# Patient Record
Sex: Female | Born: 1950
Health system: Southern US, Community
[De-identification: ages and names within clinical notes are randomized; demographics above are authoritative.]

## PROBLEM LIST (undated history)

## (undated) DIAGNOSIS — C801 Malignant (primary) neoplasm, unspecified: Secondary | ICD-10-CM

## (undated) HISTORY — PX: CHOLECYSTECTOMY: SHX55

## (undated) HISTORY — PX: CARPAL TUNNEL RELEASE: SHX101

---

## 1998-10-19 ENCOUNTER — Ambulatory Visit (HOSPITAL_BASED_OUTPATIENT_CLINIC_OR_DEPARTMENT_OTHER): Admission: RE | Admit: 1998-10-19 | Discharge: 1998-10-19 | Payer: Self-pay | Admitting: Plastic Surgery

## 2001-07-30 DIAGNOSIS — Z9221 Personal history of antineoplastic chemotherapy: Secondary | ICD-10-CM

## 2001-07-30 DIAGNOSIS — C801 Malignant (primary) neoplasm, unspecified: Secondary | ICD-10-CM

## 2001-07-30 HISTORY — PX: BREAST SURGERY: SHX581

## 2001-07-30 HISTORY — PX: LIPOSUCTION: SHX10

## 2001-07-30 HISTORY — PX: AUGMENTATION MAMMAPLASTY: SUR837

## 2001-07-30 HISTORY — DX: Malignant (primary) neoplasm, unspecified: C80.1

## 2001-07-30 HISTORY — DX: Personal history of antineoplastic chemotherapy: Z92.21

## 2001-07-30 HISTORY — PX: BREAST LUMPECTOMY: SHX2

## 2002-02-02 ENCOUNTER — Encounter: Payer: Self-pay | Admitting: *Deleted

## 2002-02-02 ENCOUNTER — Ambulatory Visit (HOSPITAL_COMMUNITY): Admission: RE | Admit: 2002-02-02 | Discharge: 2002-02-02 | Payer: Self-pay | Admitting: *Deleted

## 2002-02-06 ENCOUNTER — Encounter: Payer: Self-pay | Admitting: *Deleted

## 2002-02-06 ENCOUNTER — Ambulatory Visit (HOSPITAL_COMMUNITY): Admission: RE | Admit: 2002-02-06 | Discharge: 2002-02-06 | Payer: Self-pay | Admitting: *Deleted

## 2002-06-10 ENCOUNTER — Encounter: Admission: RE | Admit: 2002-06-10 | Discharge: 2002-06-10 | Payer: Self-pay | Admitting: Oncology

## 2002-06-10 ENCOUNTER — Encounter (HOSPITAL_COMMUNITY): Admission: RE | Admit: 2002-06-10 | Discharge: 2002-07-10 | Payer: Self-pay | Admitting: Oncology

## 2002-06-17 ENCOUNTER — Encounter (HOSPITAL_COMMUNITY): Payer: Self-pay | Admitting: Oncology

## 2002-06-29 ENCOUNTER — Ambulatory Visit (HOSPITAL_COMMUNITY): Admission: RE | Admit: 2002-06-29 | Discharge: 2002-06-29 | Payer: Self-pay | Admitting: General Surgery

## 2002-06-29 ENCOUNTER — Encounter: Payer: Self-pay | Admitting: General Surgery

## 2002-07-13 ENCOUNTER — Encounter (HOSPITAL_COMMUNITY): Admission: RE | Admit: 2002-07-13 | Discharge: 2002-08-12 | Payer: Self-pay | Admitting: Oncology

## 2002-07-13 ENCOUNTER — Encounter: Admission: RE | Admit: 2002-07-13 | Discharge: 2002-07-13 | Payer: Self-pay | Admitting: Oncology

## 2002-08-18 ENCOUNTER — Encounter: Admission: RE | Admit: 2002-08-18 | Discharge: 2002-08-18 | Payer: Self-pay | Admitting: Oncology

## 2002-08-18 ENCOUNTER — Encounter (HOSPITAL_COMMUNITY): Admission: RE | Admit: 2002-08-18 | Discharge: 2002-09-17 | Payer: Self-pay | Admitting: Oncology

## 2002-09-28 ENCOUNTER — Encounter: Admission: RE | Admit: 2002-09-28 | Discharge: 2002-09-28 | Payer: Self-pay | Admitting: Oncology

## 2002-09-28 ENCOUNTER — Encounter (HOSPITAL_COMMUNITY): Admission: RE | Admit: 2002-09-28 | Discharge: 2002-10-28 | Payer: Self-pay | Admitting: Oncology

## 2002-10-29 ENCOUNTER — Encounter (HOSPITAL_COMMUNITY): Admission: RE | Admit: 2002-10-29 | Discharge: 2002-11-28 | Payer: Self-pay | Admitting: Oncology

## 2002-10-29 ENCOUNTER — Encounter (HOSPITAL_COMMUNITY): Payer: Self-pay | Admitting: Oncology

## 2002-10-29 ENCOUNTER — Encounter: Admission: RE | Admit: 2002-10-29 | Discharge: 2002-10-29 | Payer: Self-pay | Admitting: Oncology

## 2002-12-03 ENCOUNTER — Encounter (HOSPITAL_COMMUNITY): Admission: RE | Admit: 2002-12-03 | Discharge: 2003-01-02 | Payer: Self-pay | Admitting: Oncology

## 2002-12-03 ENCOUNTER — Encounter: Admission: RE | Admit: 2002-12-03 | Discharge: 2002-12-03 | Payer: Self-pay | Admitting: Oncology

## 2003-01-05 ENCOUNTER — Encounter: Admission: RE | Admit: 2003-01-05 | Discharge: 2003-01-05 | Payer: Self-pay | Admitting: Oncology

## 2003-01-05 ENCOUNTER — Encounter (HOSPITAL_COMMUNITY): Admission: RE | Admit: 2003-01-05 | Discharge: 2003-02-04 | Payer: Self-pay | Admitting: Oncology

## 2003-02-08 ENCOUNTER — Encounter: Payer: Self-pay | Admitting: Internal Medicine

## 2003-02-08 ENCOUNTER — Ambulatory Visit (HOSPITAL_COMMUNITY): Admission: RE | Admit: 2003-02-08 | Discharge: 2003-02-08 | Payer: Self-pay | Admitting: Internal Medicine

## 2003-02-15 ENCOUNTER — Ambulatory Visit (HOSPITAL_COMMUNITY): Admission: RE | Admit: 2003-02-15 | Discharge: 2003-02-15 | Payer: Self-pay | Admitting: Internal Medicine

## 2003-02-15 ENCOUNTER — Encounter: Payer: Self-pay | Admitting: Internal Medicine

## 2003-02-17 ENCOUNTER — Encounter: Payer: Self-pay | Admitting: General Surgery

## 2003-02-17 ENCOUNTER — Ambulatory Visit (HOSPITAL_COMMUNITY): Admission: RE | Admit: 2003-02-17 | Discharge: 2003-02-17 | Payer: Self-pay | Admitting: General Surgery

## 2003-03-02 ENCOUNTER — Encounter: Admission: RE | Admit: 2003-03-02 | Discharge: 2003-03-02 | Payer: Self-pay | Admitting: Oncology

## 2003-03-02 ENCOUNTER — Encounter (HOSPITAL_COMMUNITY): Admission: RE | Admit: 2003-03-02 | Discharge: 2003-04-01 | Payer: Self-pay | Admitting: Oncology

## 2003-03-30 ENCOUNTER — Emergency Department (HOSPITAL_COMMUNITY): Admission: EM | Admit: 2003-03-30 | Discharge: 2003-03-30 | Payer: Self-pay | Admitting: Emergency Medicine

## 2003-03-30 ENCOUNTER — Encounter: Payer: Self-pay | Admitting: Emergency Medicine

## 2003-04-16 ENCOUNTER — Ambulatory Visit (HOSPITAL_COMMUNITY): Admission: RE | Admit: 2003-04-16 | Discharge: 2003-04-16 | Payer: Self-pay | Admitting: General Surgery

## 2003-04-27 ENCOUNTER — Encounter: Admission: RE | Admit: 2003-04-27 | Discharge: 2003-04-27 | Payer: Self-pay | Admitting: Oncology

## 2003-04-27 ENCOUNTER — Encounter (HOSPITAL_COMMUNITY): Admission: RE | Admit: 2003-04-27 | Discharge: 2003-04-29 | Payer: Self-pay | Admitting: Oncology

## 2003-08-24 ENCOUNTER — Encounter (HOSPITAL_COMMUNITY): Admission: RE | Admit: 2003-08-24 | Discharge: 2003-09-23 | Payer: Self-pay | Admitting: Oncology

## 2003-08-24 ENCOUNTER — Encounter: Admission: RE | Admit: 2003-08-24 | Discharge: 2003-08-24 | Payer: Self-pay | Admitting: Oncology

## 2004-02-09 ENCOUNTER — Ambulatory Visit (HOSPITAL_COMMUNITY): Admission: RE | Admit: 2004-02-09 | Discharge: 2004-02-09 | Payer: Self-pay | Admitting: Internal Medicine

## 2004-08-23 ENCOUNTER — Ambulatory Visit (HOSPITAL_COMMUNITY): Admission: RE | Admit: 2004-08-23 | Discharge: 2004-08-23 | Payer: Self-pay | Admitting: Internal Medicine

## 2004-12-20 ENCOUNTER — Ambulatory Visit (HOSPITAL_COMMUNITY): Admission: RE | Admit: 2004-12-20 | Discharge: 2004-12-20 | Payer: Self-pay | Admitting: Family Medicine

## 2004-12-26 ENCOUNTER — Ambulatory Visit (HOSPITAL_COMMUNITY): Admission: RE | Admit: 2004-12-26 | Discharge: 2004-12-26 | Payer: Self-pay | Admitting: Internal Medicine

## 2005-02-21 ENCOUNTER — Ambulatory Visit (HOSPITAL_COMMUNITY): Admission: RE | Admit: 2005-02-21 | Discharge: 2005-02-21 | Payer: Self-pay | Admitting: Internal Medicine

## 2006-02-27 ENCOUNTER — Ambulatory Visit (HOSPITAL_COMMUNITY): Admission: RE | Admit: 2006-02-27 | Discharge: 2006-02-27 | Payer: Self-pay | Admitting: Internal Medicine

## 2007-03-19 ENCOUNTER — Ambulatory Visit (HOSPITAL_COMMUNITY): Admission: RE | Admit: 2007-03-19 | Discharge: 2007-03-19 | Payer: Self-pay | Admitting: Internal Medicine

## 2008-03-29 ENCOUNTER — Ambulatory Visit (HOSPITAL_COMMUNITY): Admission: RE | Admit: 2008-03-29 | Discharge: 2008-03-29 | Payer: Self-pay | Admitting: Internal Medicine

## 2009-04-07 ENCOUNTER — Ambulatory Visit (HOSPITAL_COMMUNITY): Admission: RE | Admit: 2009-04-07 | Discharge: 2009-04-07 | Payer: Self-pay | Admitting: Internal Medicine

## 2010-05-25 ENCOUNTER — Ambulatory Visit (HOSPITAL_COMMUNITY): Admission: RE | Admit: 2010-05-25 | Discharge: 2010-05-25 | Payer: Self-pay | Admitting: Internal Medicine

## 2010-12-13 ENCOUNTER — Ambulatory Visit (HOSPITAL_COMMUNITY)
Admission: RE | Admit: 2010-12-13 | Discharge: 2010-12-13 | Disposition: A | Discharge status: Home / Self Care | Payer: 59 | Source: Ambulatory Visit | Attending: Family Medicine | Admitting: Family Medicine

## 2010-12-13 ENCOUNTER — Other Ambulatory Visit (HOSPITAL_COMMUNITY): Payer: Self-pay | Admitting: Family Medicine

## 2010-12-13 DIAGNOSIS — N6320 Unspecified lump in the left breast, unspecified quadrant: Secondary | ICD-10-CM

## 2010-12-13 DIAGNOSIS — Z853 Personal history of malignant neoplasm of breast: Secondary | ICD-10-CM | POA: Insufficient documentation

## 2010-12-15 NOTE — H&P (Signed)
NAME:  Angelica White, Angelica White NO.:  [id number]   MEDICAL RECORD NO.:  [id number]                   PATIENT TYPE:   LOCATION:                                       FACILITY:  [facility]   PHYSICIAN:  Dalia Heading, M.D.               DATE OF BIRTH:  1951/02/14   DATE OF ADMISSION:  DATE OF DISCHARGE:                                HISTORY & PHYSICAL   CHIEF COMPLAINT:  Right breast mass, nonpalpable.   HISTORY OF PRESENT ILLNESS:  The patient is a 60 year old white female who  was referred for evaluation and treatment of microcalcification seen in the  right breast.  This was seen on routine mammography.  She has a history of  left breast carcinoma, status post mastectomy with TRAM flap reconstruction,  in the past,  in 2003 at [facility].  She denies any recent nipple  discharge.   PAST MEDICAL HISTORY:  As noted above.   PAST SURGICAL HISTORY:  1. As noted above.  2. Cholecystectomy.  3. Foot surgery.  4. Hysterectomy.   CURRENT MEDICATIONS:  1. Percocet as needed for pain.  2. Lovenox.   ALLERGIES:  AUGMENTIN.   REVIEW OF SYSTEMS:  Patient had a right leg DVT diagnosed recently.  She did  not tolerate Coumadin and thus was placed on Lovenox.   PHYSICAL EXAMINATION:  GENERAL:  Patient is a well-developed, well-  nourished, white female in no acute distress.  VITAL SIGNS:  She is afebrile and vital signs are stable.  LUNGS:  Clear to auscultation with equal breath sounds bilaterally.  HEART:  Reveals a regular rate and rhythm without S3, S4 or murmurs.  CHEST:  Right breast examination reveals no dominant mass, nipple discharge  or dimpling.  The axilla is negative for palpable nodes.  Left breast examination reveals a deformed breast due to reconstructive  surgery.   Mammogram reveals a suspicious microcalcification seen in the upper, outer  quadrant of the right breast.   IMPRESSION:  1. Right breast mass, nonpalpable.  2. History  of left breast carcinoma.   PLAN:  The patient is scheduled for right breast biopsy after needle  localization on February 17, 2003.  The risks and benefits of the procedure  including bleeding and infection were fully explained to the patient, gave  informed consent.  She has stopped her Lovenox today for her surgery  tomorrow.                                               Dalia Heading, M.D.    MAJ/MEDQ  D:  02/16/2003  T:  02/16/2003  Job:  [id number]   cc:   Ladona Horns. Neijstrom, MD  [address]  [place]  [place] [address]  Fax: [phone]   Patrica Duel, M.D.  [address]  [place]  [place] [address]  Fax: [phone]

## 2010-12-15 NOTE — Procedures (Signed)
   NAMELAVEDA, DEMEDEIROS NO.:  [id number]   MEDICAL RECORD NO.:  [id number]                    PATIENT TYPE:   LOCATION:                                       FACILITY:   PHYSICIAN:  Vida Roller, M.D.                DATE OF BIRTH:   DATE OF PROCEDURE:  DATE OF DISCHARGE:                                  ECHOCARDIOGRAM   REFERRING PHYSICIAN:  Ladona Horns. Neijstrom, MD   PROCEDURE:  Echocardiogram.   TAPE:  #[id number], tape count 3482 to 3900   REASON FOR PROCEDURE:  This is a 60 year old lady followup chemotherapy for  breast cancer.  This is an assessment for LV function.   QUALITY OF STUDY:  Technical quality of this study is poor.   M-MODE MEASUREMENTS:  1. The aorta is 30 mm.  2. The left atrium is 36 mm.  3. The septum is 10 mm.  4. The diastolic dimension is 44 mm.  5. The left ventricular systolic dimension is 36 mm.   2-D AND DOPPLER IMAGING:  1. The left ventricle is normal size with normal systolic function.     Diastolic function is beyond the limits of the study.  There was no     obvious wall motion abnormality seen.  2. The right ventricle is normal size with normal systolic function.  3. Both atria appear normal size.  4. The aortic valve is not well seen but appears to function normally with     no evidence of stenosis or regurgitation.  5. The mitral valve is not well seen either but appears to have only trace     mitral insufficiency.  No stenosis is seen.  6. The tricuspid valve is not well seen.  7. The pulmonic valve is not well seen.  8. The pericardial structures appear normal.  9. The ascending aorta is not well seen.                                               Vida Roller, M.D.    JH/MEDQ  D:  10/05/2002  T:  10/05/2002  Job:  [id number]

## 2010-12-15 NOTE — Op Note (Signed)
   NAME:  Angelica White, Angelica White                         ACCOUNT NO.:  [id number]   MEDICAL RECORD NO.:  [id number]                   PATIENT TYPE:  AMB   LOCATION:  DAY                                  FACILITY:  [facility]   PHYSICIAN:  Dalia Heading, M.D.               DATE OF BIRTH:  May 16, 1951   DATE OF PROCEDURE:  04/16/2003  DATE OF DISCHARGE:                                 OPERATIVE REPORT   PREOPERATIVE DIAGNOSIS:  History of left breast carcinoma, finished with  chemotherapy.   POSTOPERATIVE DIAGNOSIS:  History of left breast carcinoma, finished with  chemotherapy.   PROCEDURE:  Port-A-Cath removal   SURGEON:  Dalia Heading, M.D.   ANESTHESIA:  Local   INDICATIONS:  The patient is a 60 year old white female with a history of  breast carcinoma who now presents for Port-A-Cath removal.  The risks and  benefits of the procedure were fully explained to the patient, who gave  informed consent.   DESCRIPTION OF PROCEDURE:  The patient was placed in the supine position.  The right upper chest was prepped and draped using the usual sterile  technique with Betadine.  Surgical site confirmation was performed.  Six  cc's of 1% Xylocaine was used for local anesthesia.   An incision was made through the previous surgical scar.  It was taken down  to the port.  The Port-A-Cath was removed in total without difficulty. No  significant bleeding was noted.  The subcutaneous layer was reapproximated  using a 3-0 Vicryl interrupted suture.  The skin was closed using a 4-0  Vicryl subcuticular suture.  Steri-Strips and a dry sterile dressing were  applied.   All tape and needle counts were correct at the end of the procedure.  The  patient was transferred to Day Surgery in stable condition.   COMPLICATIONS:  None    SPECIMEN:  Port-A-Cath   BLOOD LOSS:  Minimal                                               Dalia Heading, M.D.    MAJ/MEDQ  D:  04/16/2003  T:  04/16/2003  Job:   [id number]   cc:   Ladona Horns. Neijstrom, MD  [address]  [place]  [place] [address]  Fax: [phone]   Patrica Duel, M.D.  [address]  [place]  [place] [address]  Fax: [phone]

## 2010-12-15 NOTE — Op Note (Signed)
NAME:  Angelica White, Angelica White                         ACCOUNT NO.:  [id number]   MEDICAL RECORD NO.:  [id number]                   PATIENT TYPE:  AMB   LOCATION:  DAY                                  FACILITY:  [facility]   PHYSICIAN:  Dalia Heading, M.D.               DATE OF BIRTH:  1951/06/17   DATE OF PROCEDURE:  DATE OF DISCHARGE:                                 OPERATIVE REPORT   PREOPERATIVE DIAGNOSIS:  Right breast mass, nonpalpable.   POSTOPERATIVE DIAGNOSIS:  Right breast mass, nonpalpable.   PROCEDURE:  Right breast biopsy after needle localization.   SURGEON:  Dalia Heading, M.D.   ANESTHESIA:  MAC   INDICATIONS:  The patient is a 60 year old white female with a history of  left breast carcinoma who now presents with microcalcifications in the  upper, outer quadrant of the right breast.  She comes to the operating room  after undergoing needle localization in the mammography suite for right  breast biopsy after needle localization.  The risks and benefits of the  procedure including bleeding and infection were fully explained to the  patient, who gave informed consent.   DESCRIPTION OF PROCEDURE:  The patient was placed in the supine position.  The right breast was prepped and draped using the usual sterile technique  with Betadine.  Surgical site confirmation was performed.  Xylocaine 1% was  used for local anesthesia.   A transverse incision was made across where the wire entered the skin.  The  dissection was taken down to the tip of the wire and a circumferential area  of right breast tissue was removed.  Specimen radiography revealed that most  of the microcalcifications were within the specimen removed.  The patient  was noted to have significant sclerosis in the right breast region.  Any  bleeding was controlled using Bovie electrocautery.  The specimen was then  sent to pathology for further examination.  The wound was irrigated with  normal saline.  The skin was  closed using a 4-0 Vicryl subcuticular suture.  Steri-Strips and a dry sterile dressing were applied.   All tape and needle counts were correct at the end of the procedure.  The  patient was awakened ad transferred to Day Surgery in stable condition.   COMPLICATIONS:  None.    SPECIMEN:  Right breast microcalcifications.   BLOOD LOSS:  Minimal.                                               Dalia Heading, M.D.    MAJ/MEDQ  D:  02/17/2003  T:  02/17/2003  Job:  [id number]   cc:   Ladona Horns. Neijstrom, MD  [address]  [place]  [place] [address]  Fax: [phone]   Loraine Leriche  Nobie Putnam, M.D.  [address]  [place]  [place] [address]  Fax: [phone]

## 2010-12-15 NOTE — Op Note (Signed)
   NAME:  Angelica White, Angelica White                         ACCOUNT NO.:  [id number]   MEDICAL RECORD NO.:  [id number]                   PATIENT TYPE:  AMB   LOCATION:  DAY                                  FACILITY:  [facility]   PHYSICIAN:  Dalia Heading, M.D.               DATE OF BIRTH:  10/28/1950   DATE OF PROCEDURE:  06/29/2002  DATE OF DISCHARGE:                                 OPERATIVE REPORT   PREOPERATIVE DIAGNOSIS:  Left breast carcinoma.   POSTOPERATIVE DIAGNOSIS:  Left breast carcinoma.   PROCEDURE:  Port-A-Cath insertion.   ANESTHESIA:  MAC.   INDICATIONS:  The patient is a 60 year old white female who presents for  Port-A-Cath insertion.  She is about to undergo chemotherapy for left breast  carcinoma.  The risks and benefits of the procedure, including bleeding,  infection, and the possibility of a pneumothorax were fully explained to the  patient, gaining informed consent.   DESCRIPTION OF PROCEDURE:  The patient was placed in the Trendelenburg  position after the right upper chest was prepped and draped using the usual  sterile technique with Betadine.  Xylocaine 1% was used for local  anesthesia.  Surgical site confirmation was performed.   A transverse incision was made just below the right clavicle.  This was  taken down to the subcutaneous tissue.  A needle was advanced into the right  subclavian vein without difficulty.  A guidewire was then advanced into the  superior vena cava under fluoroscopic guidance.  An introducer and peel away  sheath were then placed over the guidewire.  A catheter was then inserted  through the peel away sheath and the peel away sheath was removed.  The  catheter was then attached to the port and the port was placed in a  subcutaneous pocket.  Fluoroscopy was used to confirm adequate position.  The port was flushed with 3000 units of heparin.  The subcutaneous layer was  reapproximated using a 4-0 Vicryl interrupted suture.  The skin was  closed  using a 4-0 Vicryl subcuticular suture.  Steri-Strips and a dry sterile  dressing were applied.   All needle counts were correct at the end of the procedure.  The patient was  transferred to PACU in stable condition.  Complications:  None.  Specimens:  None.  Blood loss:  Minimal.                                               Dalia Heading, M.D.    MAJ/MEDQ  D:  06/29/2002  T:  06/29/2002  Job:  [id number]   cc:   Ladona Horns. Neijstrom, M.D.  [address]  [place]  [place] [address]  Fax: [phone]

## 2010-12-15 NOTE — H&P (Signed)
   NAME:  Angelica White, Angelica White NO.:  [id number]   MEDICAL RECORD NO.:  [id number]                   PATIENT TYPE:   LOCATION:                                       FACILITY:   PHYSICIAN:  Dalia Heading, M.D.               DATE OF BIRTH:  November 22, 1950   DATE OF ADMISSION:  04/12/2003  DATE OF DISCHARGE:                                HISTORY & PHYSICAL   CHIEF COMPLAINT:  History of right breast carcinoma, finished with  chemotherapy.   HISTORY OF PRESENT ILLNESS:  The patient is a 60 year old, white female who  is referred for a Port-A-Cath removal.  She had a Port-A-Cath placed in  November 2003, and now presents for Port-A-Cath removal.   PAST MEDICAL HISTORY:  As noted above.   PAST SURGICAL HISTORY:  1. Right breast biopsy with needle localization in July 2004.  2. Multiple surgeries for her left breast carcinoma.  3. Cholecystectomy.  4. Foot surgery.  5. Hysterectomy.   MEDICATIONS:  1. Percocet as needed for pain.  2. Coumadin, which she is holding, and has switched to Lovenox.   ALLERGIES:  AUGMENTIN.   REVIEW OF SYMPTOMS:  The patient had a right leg DVT diagnosed in the past.   PHYSICAL EXAMINATION:  GENERAL:  The patient is a well-developed, well-  nourished, white female in no acute distress.  VITAL SIGNS:  Afebrile with vital signs stable.  LUNGS:  Clear to auscultation with equal breath sounds bilaterally.  HEART:  Regular rate and rhythm without S3, S4 or murmurs.  CHEST:  Port-A-Cath in place in the right upper chest.  She also has  multiple reconstructions on the left breast.   IMPRESSION:  Left breast carcinoma, finished with chemotherapy.    PLAN:  The patient is scheduled for Port-A-Cath removal on April 16, 2003.  The risks and benefits of the procedure, including bleeding and  infection were fully explained to the patient gaining informed consent.                                               Dalia Heading,  M.D.    MAJ/MEDQ  D:  04/12/2003  T:  04/12/2003  Job:  [id number]   cc:   Ladona Horns. Neijstrom, MD  [address]  [place]  [place] [address]  Fax: [phone]   Patrica Duel, M.D.  [address]  [place]  [place] [address]  Fax: [phone]

## 2010-12-15 NOTE — H&P (Signed)
   NAME:  Angelica White, Angelica White NO.:  [id number]   MEDICAL RECORD NO.:  [id number]                  PATIENT TYPE:   LOCATION:                                       FACILITY:   PHYSICIAN:  Dalia Heading, M.D.               DATE OF BIRTH:  12-02-1950   DATE OF ADMISSION:  DATE OF DISCHARGE:                                HISTORY & PHYSICAL   CHIEF COMPLAINT:  Left breast carcinoma, need for chemotherapy.   HISTORY OF PRESENT ILLNESS:  The patient is a 60 year old white female who  is referred for a Port-A-Cath insertion.  She had left breast cancer status  post left mastectomy with reconstruction at [facility] in  September.  She is now about to undergo chemotherapy and needs central  venous access.   PAST MEDICAL HISTORY:  As noted above.   PAST SURGICAL HISTORY:  As noted above, cholecystectomy, foot surgery,  hysterectomy.   CURRENT MEDICATIONS:  Percocet as needed for pain.   ALLERGIES:  AUGMENTIN.   REVIEW OF SYSTEMS:  Noncontributory.   PHYSICAL EXAMINATION:  GENERAL:  The patient is a well-developed, well-  nourished white female in no acute distress.  VITAL SIGNS:  She is afebrile and vital signs are stable.  LUNGS:  Clear to auscultation with equal breath sounds bilaterally.  HEART:  Regular rate and rhythm without S3, S4, murmurs.   IMPRESSION:  Left breast carcinoma, need for central venous access for  chemotherapy.    PLAN:  The patient is scheduled for a Port-A-Cath insertion on June 29, 2002.  The risks and benefits of the procedure including bleeding,  infection, and pneumothorax were fully explained to the patient.  Gave  informed consent.                                               Dalia Heading, M.D.    MAJ/MEDQ  D:  06/23/2002  T:  06/23/2002  Job:  [id number]   cc:   Ladona Horns. Neijstrom, M.D.  [address]  [place]  [place] [address]  Fax: [phone]

## 2013-02-02 ENCOUNTER — Other Ambulatory Visit (HOSPITAL_COMMUNITY): Payer: Self-pay | Admitting: Internal Medicine

## 2013-02-02 DIAGNOSIS — Z139 Encounter for screening, unspecified: Secondary | ICD-10-CM

## 2013-02-23 ENCOUNTER — Other Ambulatory Visit (HOSPITAL_COMMUNITY): Payer: Self-pay | Admitting: General Practice

## 2013-02-23 ENCOUNTER — Ambulatory Visit (HOSPITAL_COMMUNITY)
Admission: RE | Admit: 2013-02-23 | Discharge: 2013-02-23 | Disposition: A | Discharge status: Home / Self Care | Payer: 59 | Source: Ambulatory Visit | Attending: Internal Medicine | Admitting: Internal Medicine

## 2013-02-23 DIAGNOSIS — Z139 Encounter for screening, unspecified: Secondary | ICD-10-CM

## 2013-02-23 DIAGNOSIS — Z1231 Encounter for screening mammogram for malignant neoplasm of breast: Secondary | ICD-10-CM | POA: Insufficient documentation

## 2014-02-03 ENCOUNTER — Other Ambulatory Visit (HOSPITAL_COMMUNITY): Payer: Self-pay | Admitting: Family

## 2014-02-03 DIAGNOSIS — Z1231 Encounter for screening mammogram for malignant neoplasm of breast: Secondary | ICD-10-CM

## 2014-02-24 ENCOUNTER — Ambulatory Visit (HOSPITAL_COMMUNITY)
Admission: RE | Admit: 2014-02-24 | Discharge: 2014-02-24 | Disposition: A | Discharge status: Home / Self Care | Payer: 59 | Source: Ambulatory Visit | Attending: Family | Admitting: Family

## 2014-02-24 DIAGNOSIS — Z1231 Encounter for screening mammogram for malignant neoplasm of breast: Secondary | ICD-10-CM

## 2015-02-14 ENCOUNTER — Other Ambulatory Visit (HOSPITAL_COMMUNITY): Payer: Self-pay | Admitting: Sports Medicine

## 2015-02-14 DIAGNOSIS — R109 Unspecified abdominal pain: Secondary | ICD-10-CM

## 2015-02-17 ENCOUNTER — Ambulatory Visit (HOSPITAL_COMMUNITY): Payer: 59

## 2015-02-21 ENCOUNTER — Other Ambulatory Visit (HOSPITAL_COMMUNITY): Payer: Self-pay | Admitting: Sports Medicine

## 2015-02-21 DIAGNOSIS — R109 Unspecified abdominal pain: Secondary | ICD-10-CM

## 2015-02-23 ENCOUNTER — Ambulatory Visit (HOSPITAL_COMMUNITY)
Admission: RE | Admit: 2015-02-23 | Discharge: 2015-02-23 | Disposition: A | Discharge status: Home / Self Care | Payer: 59 | Source: Ambulatory Visit | Attending: Sports Medicine | Admitting: Sports Medicine

## 2015-02-23 DIAGNOSIS — R109 Unspecified abdominal pain: Secondary | ICD-10-CM

## 2015-02-23 DIAGNOSIS — Z90710 Acquired absence of both cervix and uterus: Secondary | ICD-10-CM | POA: Insufficient documentation

## 2015-02-23 DIAGNOSIS — R932 Abnormal findings on diagnostic imaging of liver and biliary tract: Secondary | ICD-10-CM | POA: Diagnosis not present

## 2015-02-23 DIAGNOSIS — Z9049 Acquired absence of other specified parts of digestive tract: Secondary | ICD-10-CM | POA: Insufficient documentation

## 2015-02-28 ENCOUNTER — Other Ambulatory Visit (HOSPITAL_COMMUNITY): Payer: Self-pay | Admitting: Internal Medicine

## 2015-02-28 DIAGNOSIS — Z1231 Encounter for screening mammogram for malignant neoplasm of breast: Secondary | ICD-10-CM

## 2015-03-07 ENCOUNTER — Ambulatory Visit (HOSPITAL_COMMUNITY)
Admission: RE | Admit: 2015-03-07 | Discharge: 2015-03-07 | Disposition: A | Discharge status: Home / Self Care | Payer: 59 | Source: Ambulatory Visit | Attending: Internal Medicine | Admitting: Internal Medicine

## 2015-03-07 DIAGNOSIS — Z1231 Encounter for screening mammogram for malignant neoplasm of breast: Secondary | ICD-10-CM | POA: Diagnosis present

## 2015-12-19 DIAGNOSIS — H2513 Age-related nuclear cataract, bilateral: Secondary | ICD-10-CM | POA: Diagnosis not present

## 2016-02-27 ENCOUNTER — Other Ambulatory Visit (HOSPITAL_COMMUNITY): Payer: Self-pay | Admitting: Internal Medicine

## 2016-02-27 DIAGNOSIS — Z1231 Encounter for screening mammogram for malignant neoplasm of breast: Secondary | ICD-10-CM

## 2016-03-12 ENCOUNTER — Ambulatory Visit (HOSPITAL_COMMUNITY)
Admission: RE | Admit: 2016-03-12 | Discharge: 2016-03-12 | Disposition: A | Discharge status: Home / Self Care | Payer: Medicare Other | Source: Ambulatory Visit | Attending: Internal Medicine | Admitting: Internal Medicine

## 2016-03-12 DIAGNOSIS — Z1231 Encounter for screening mammogram for malignant neoplasm of breast: Secondary | ICD-10-CM | POA: Insufficient documentation

## 2016-03-12 DIAGNOSIS — E782 Mixed hyperlipidemia: Secondary | ICD-10-CM | POA: Diagnosis not present

## 2016-03-12 DIAGNOSIS — R7301 Impaired fasting glucose: Secondary | ICD-10-CM | POA: Diagnosis not present

## 2016-03-19 DIAGNOSIS — E6609 Other obesity due to excess calories: Secondary | ICD-10-CM | POA: Diagnosis not present

## 2016-03-19 DIAGNOSIS — R7301 Impaired fasting glucose: Secondary | ICD-10-CM | POA: Diagnosis not present

## 2016-06-10 IMAGING — US US TRANSVAGINAL NON-OB
1 series · 14 of 25 positions shown · non-contrast
Comparison: None

CLINICAL DATA: Right back and flank pain. Right lower quadrant
pain. Symptoms for 1 month. Prior hysterectomy.



[Series 1: us transvaginal non-ob · 0.21mm/px · 14 of 74 slices shown]
[im 1/74]
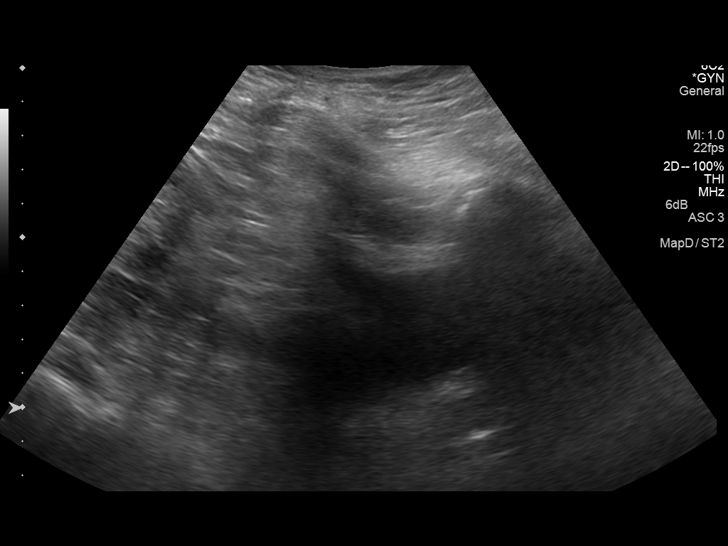
[im 7/74]
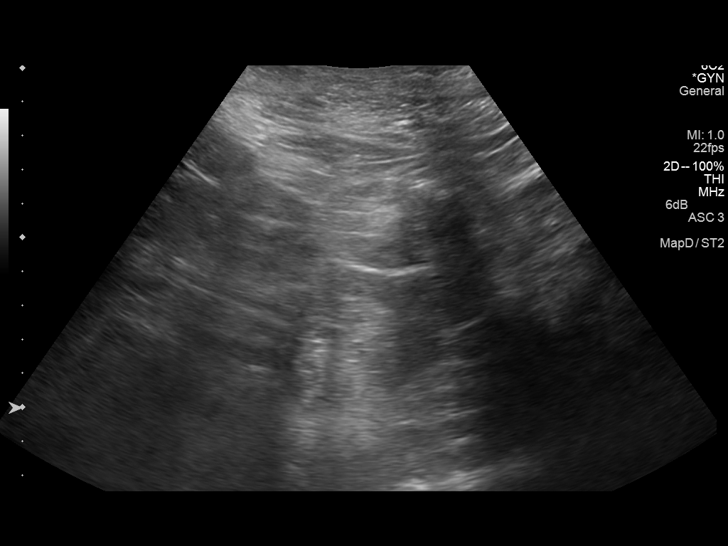
[im 13/74]
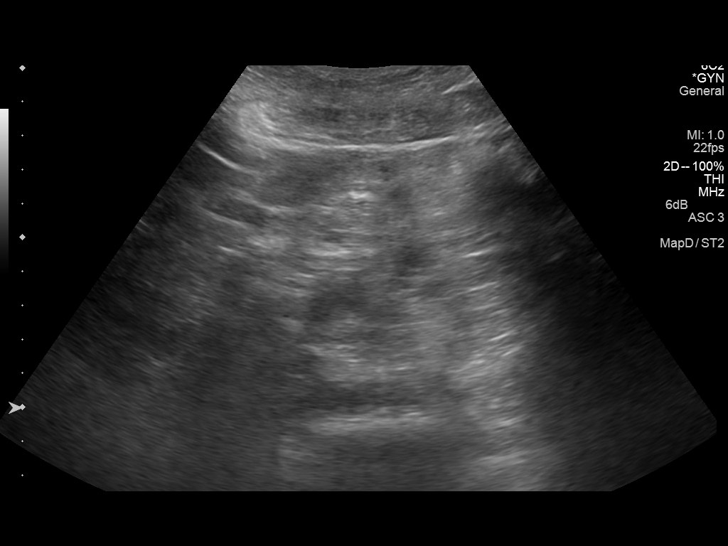
[im 19/74]
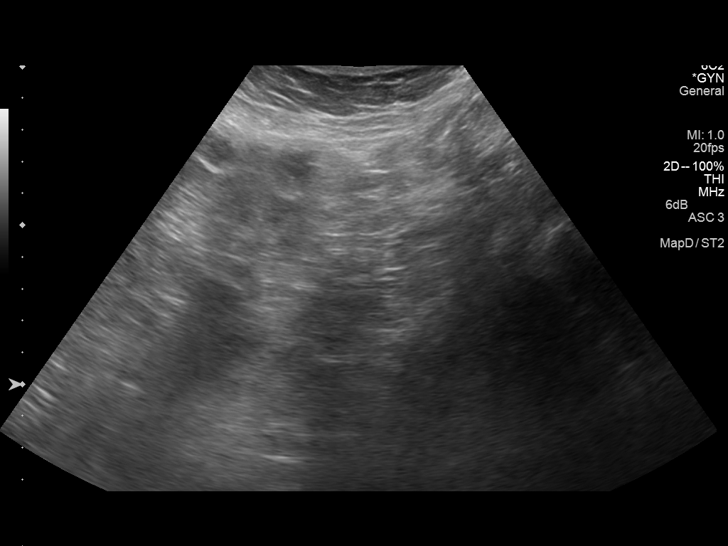
[im 25/74]
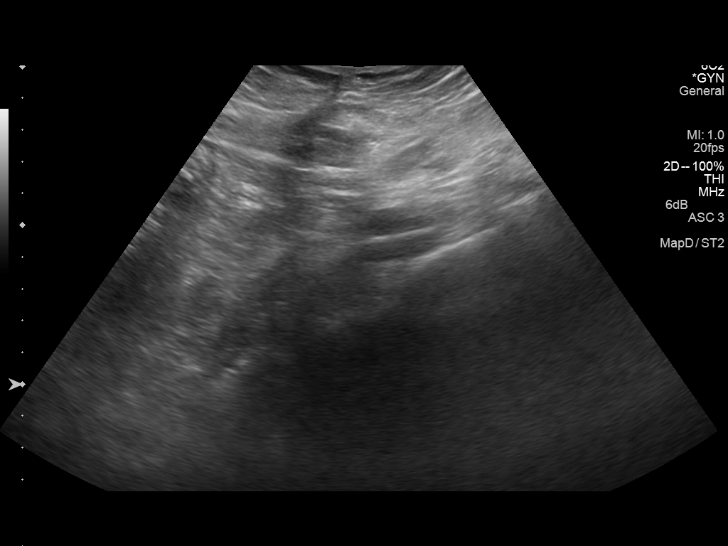
[im 28/74]
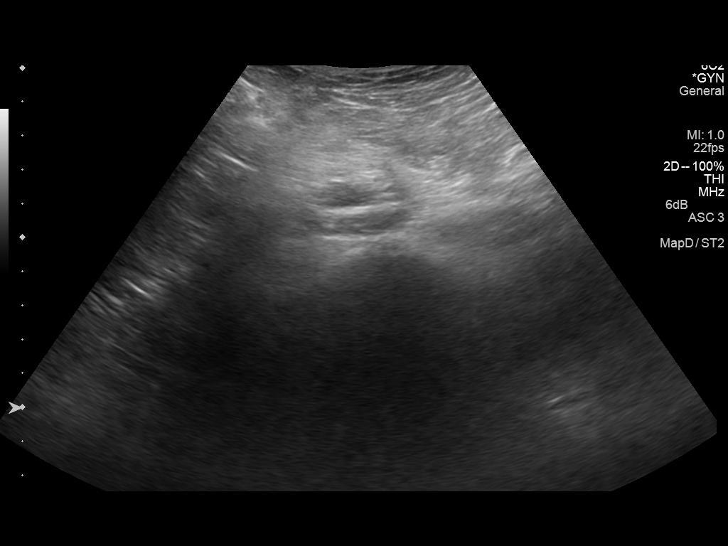
[im 34/74]
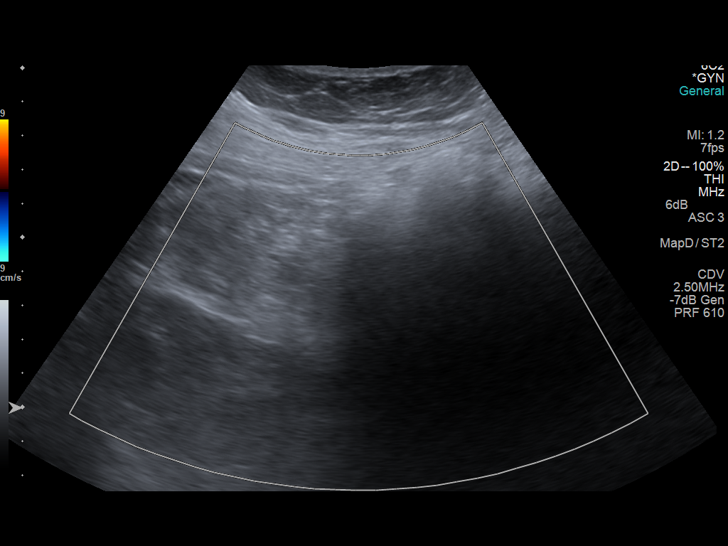
[im 40/74]
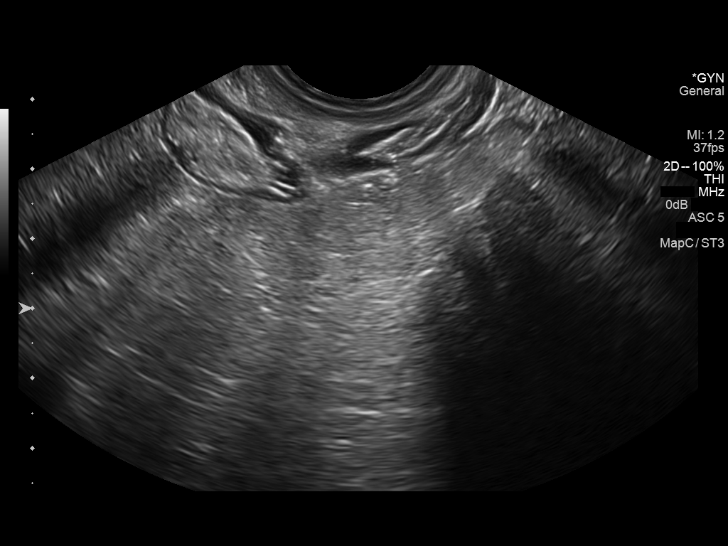
[im 46/74]
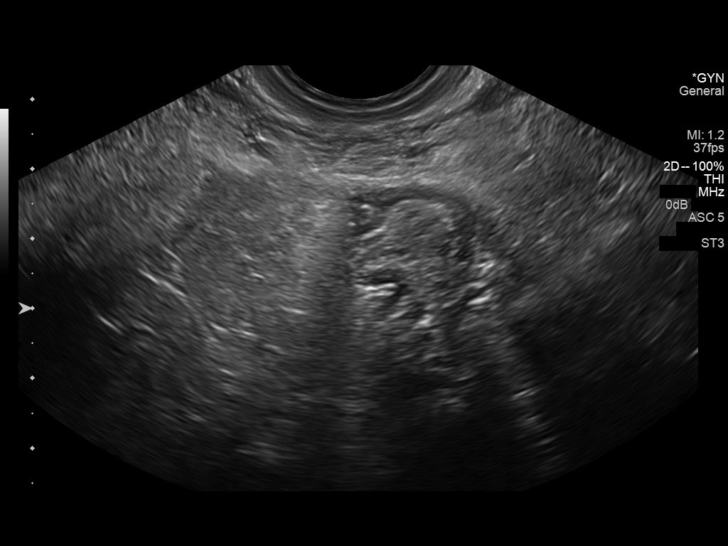
[im 49/74]
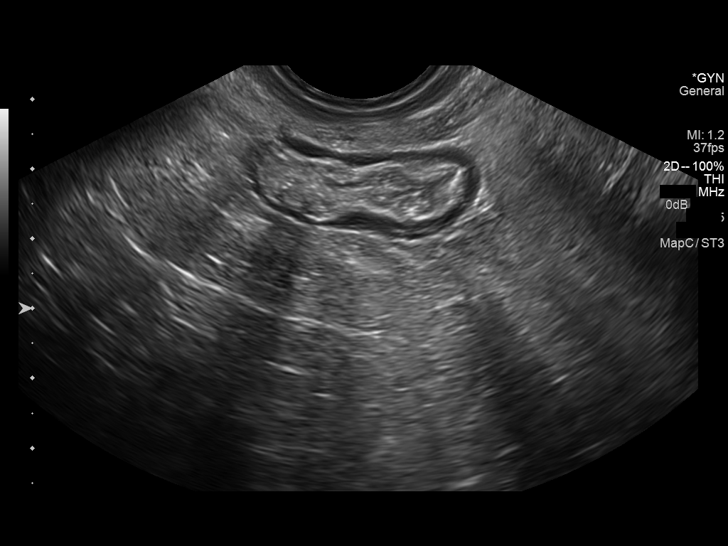
[im 55/74]
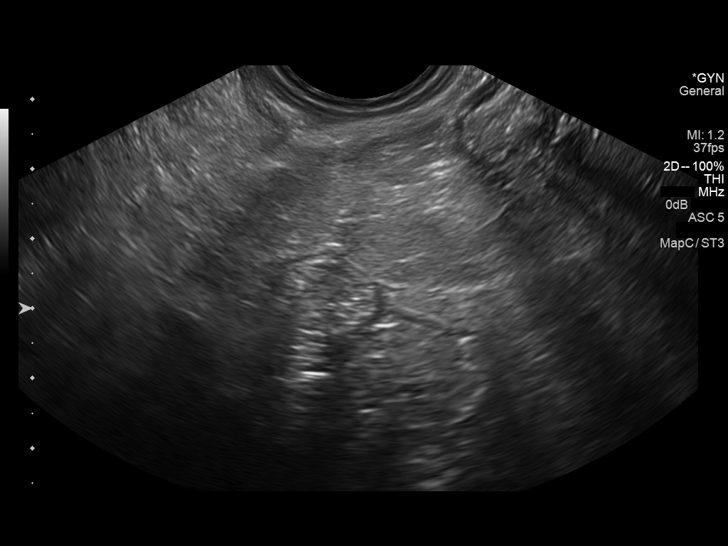
[im 61/74]
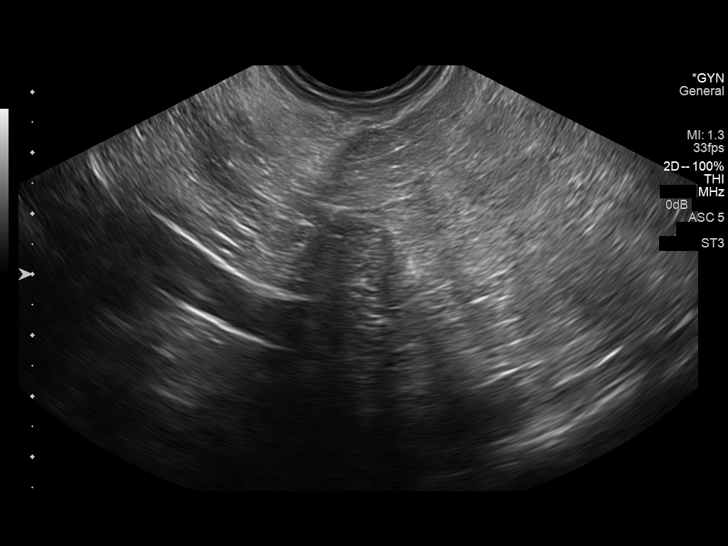
[im 67/74]
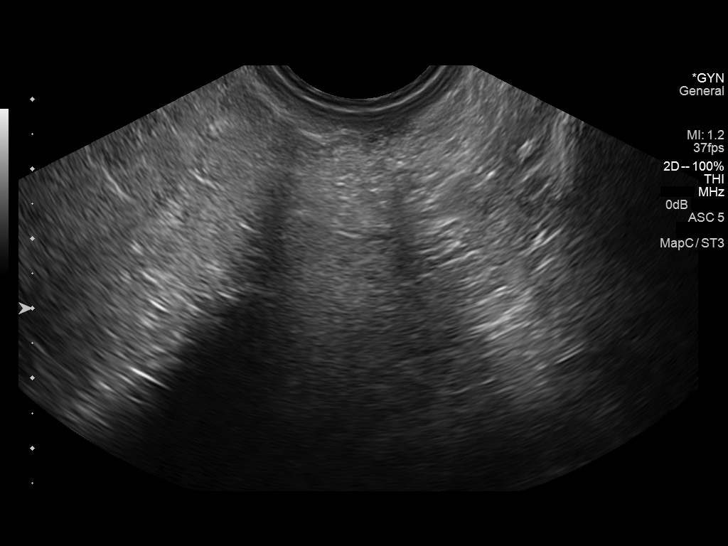
[im 74/74]
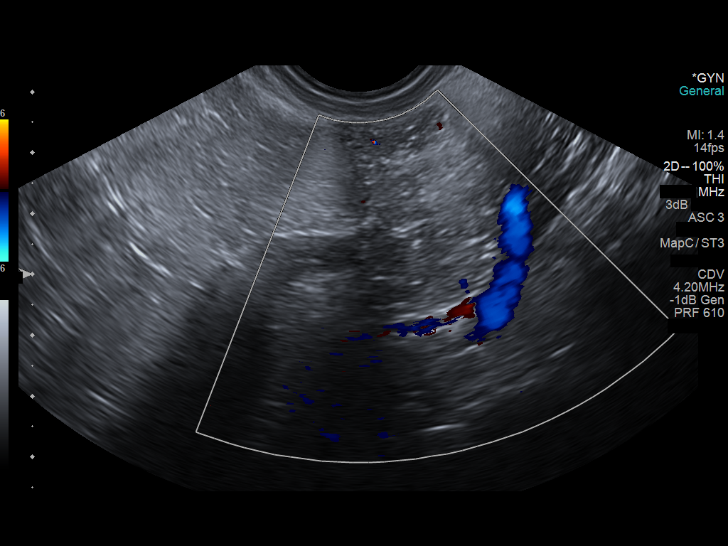

[14 of 25 positions shown; findings below may reference images not displayed]

FINDINGS: Uterus

Measurements: Prior hysterectomy.

Endometrium

Thickness: N/A.

Right ovary

Measurements: Not visualized due to excessive bowel gas in the
pelvis.. No adnexal masses seen.

Left ovary

Measurements: Not visualized due to excessive bowel gas in the
pelvis. No adnexal masses seen.

Other findings

No free fluid.
IMPRESSION: Limited exam due to excessive bowel gas in the pelvis. No adnexal
masses seen. Prior hysterectomy.

## 2016-06-10 IMAGING — US US ABDOMEN COMPLETE
1 series · 14 of 25 positions shown · non-contrast
Comparison: None.

CLINICAL DATA: Right back and flank pain for 1 month, currently
under treatment for bladder infection

EXAM:
ULTRASOUND ABDOMEN COMPLETE

[Series 1: us abdomen complete · 0.21mm/px · 14 of 120 slices shown]
[im 1/120]
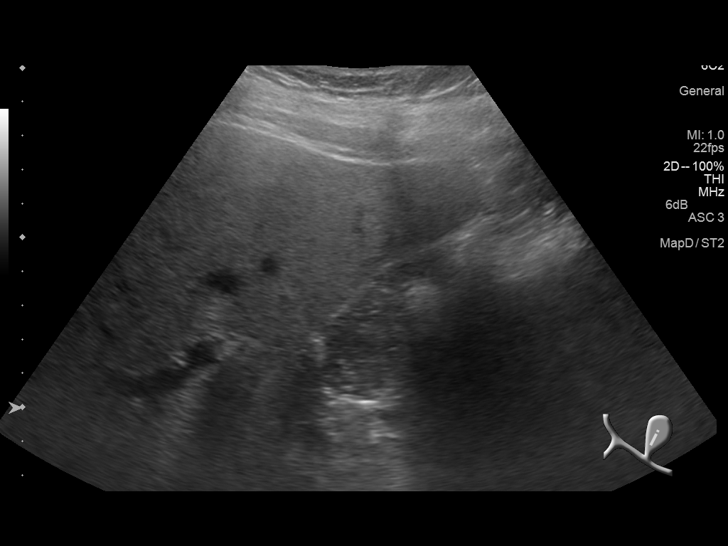
[im 10/120]
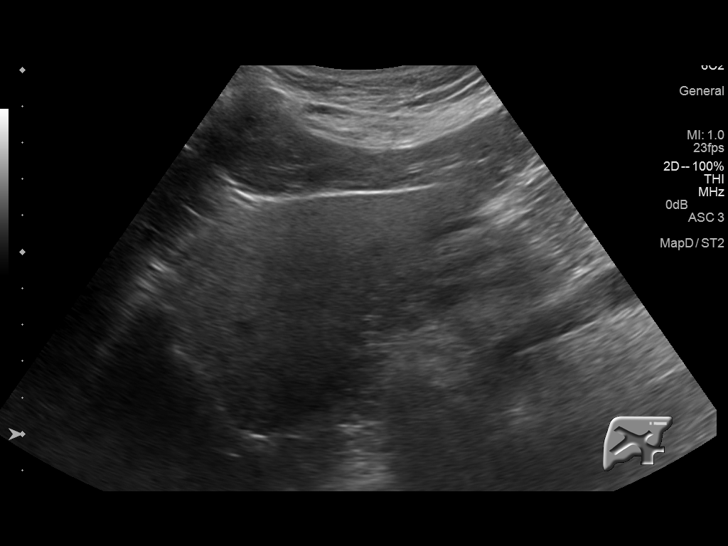
[im 20/120]
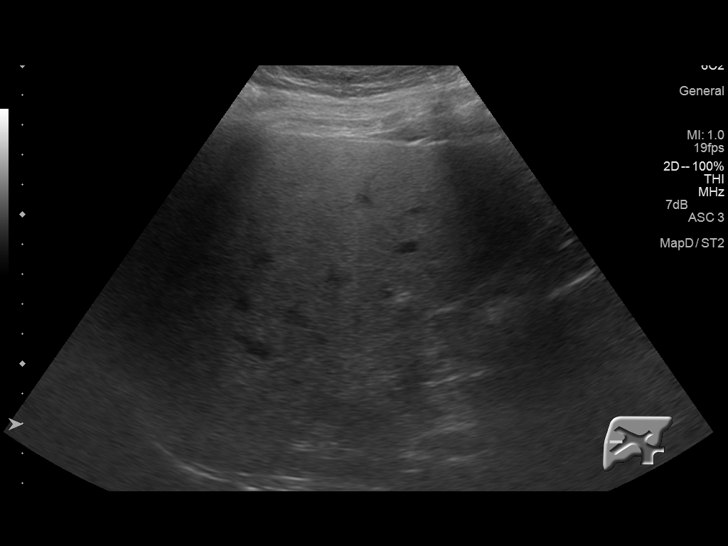
[im 30/120]
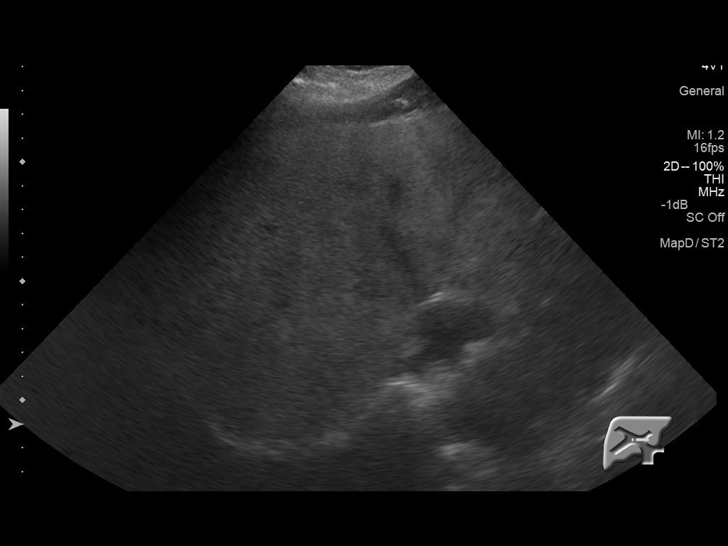
[im 40/120]
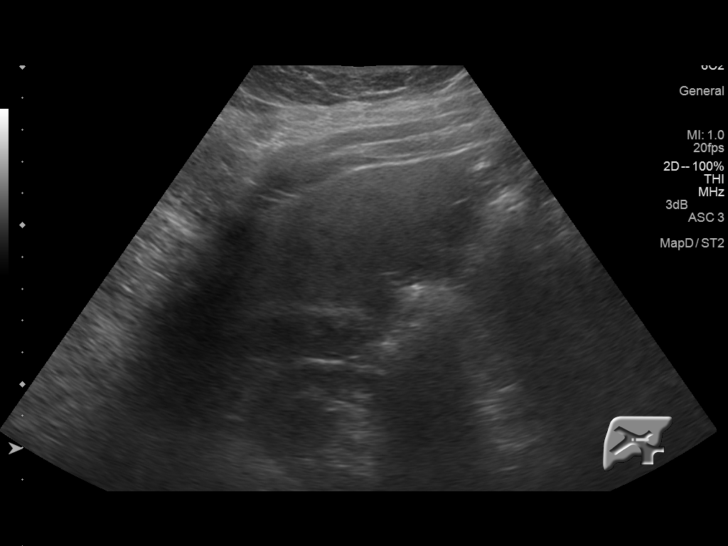
[im 45/120]
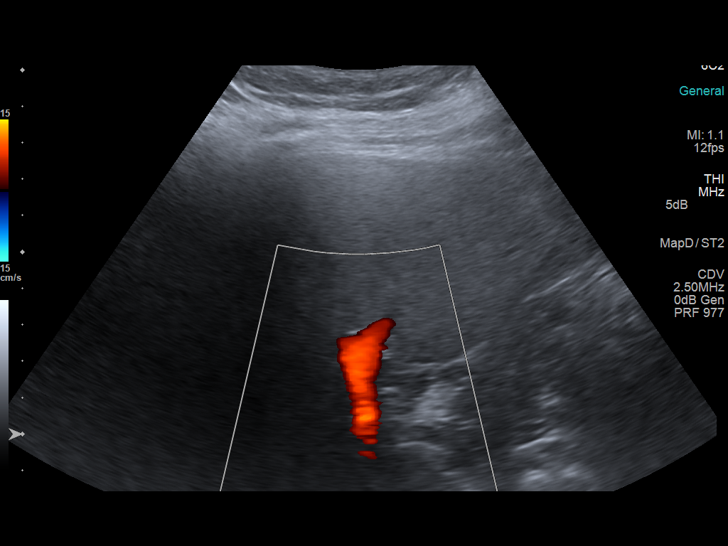
[im 55/120]
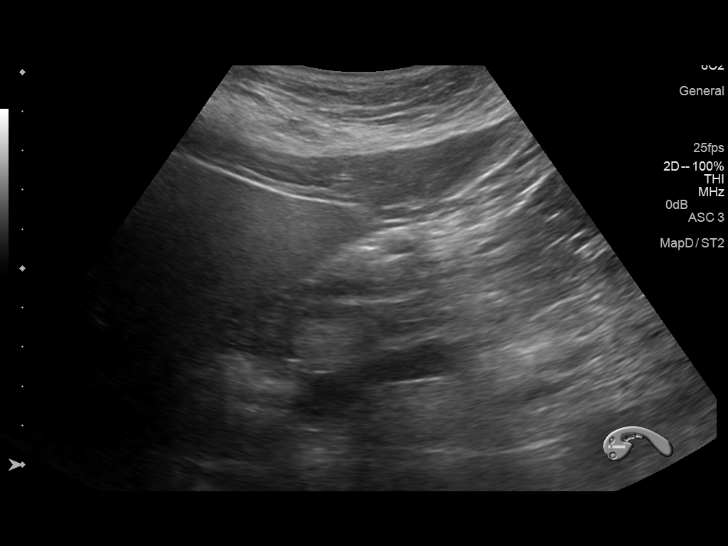
[im 65/120]
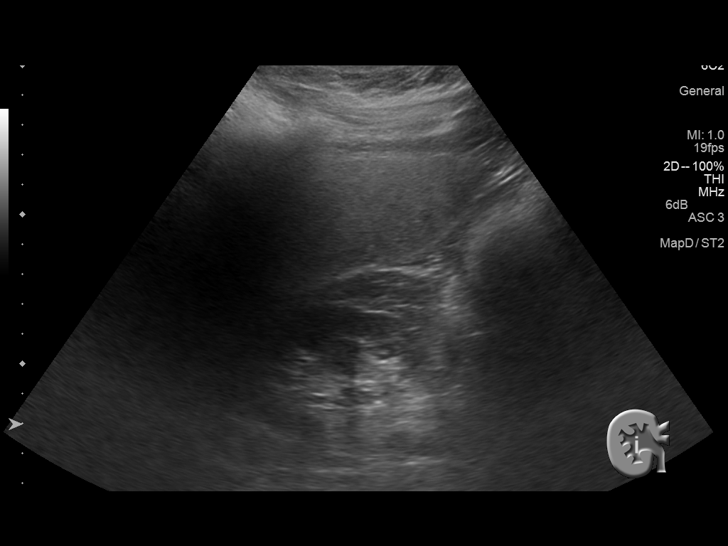
[im 75/120]
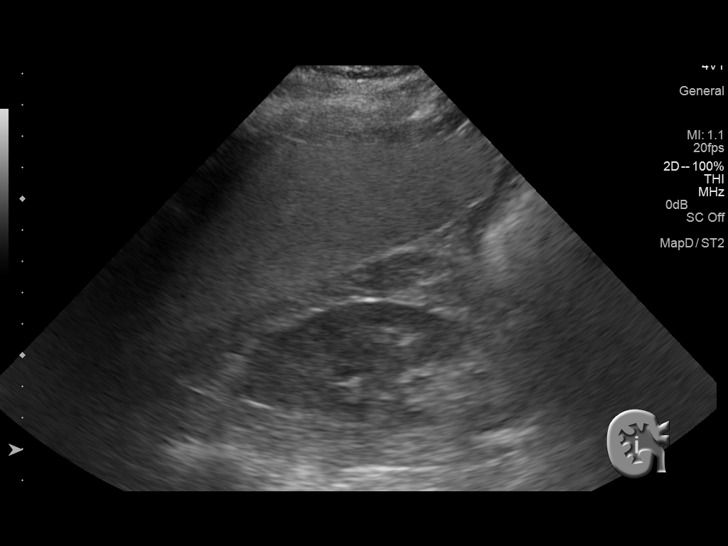
[im 80/120]
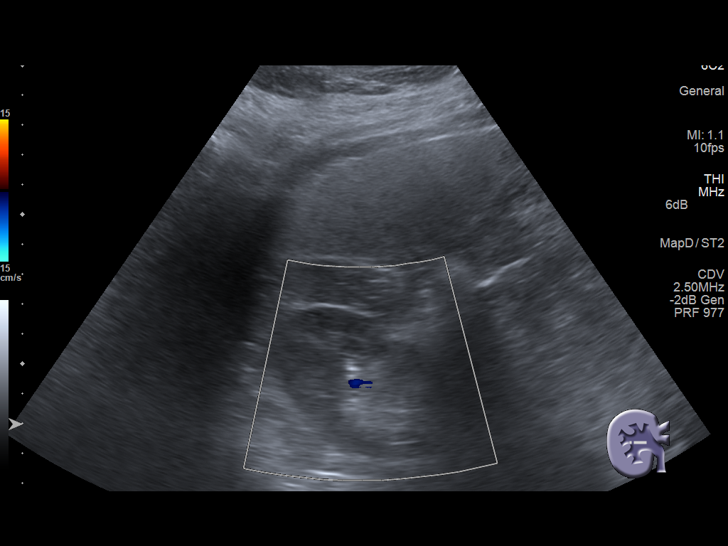
[im 90/120]
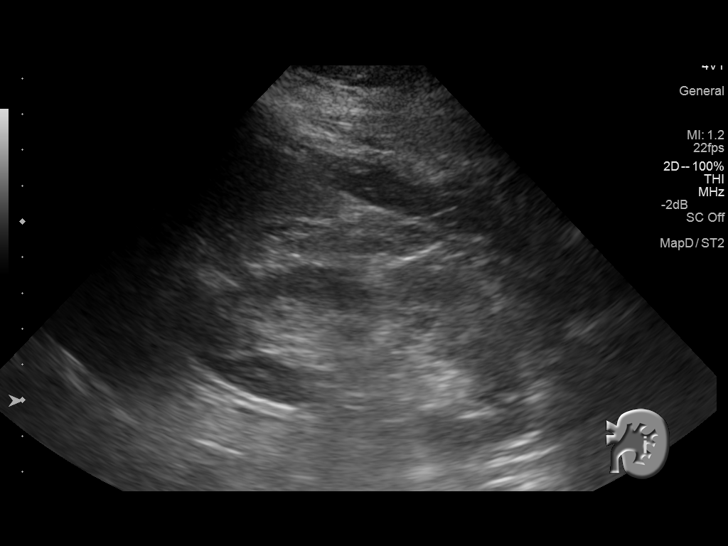
[im 100/120]
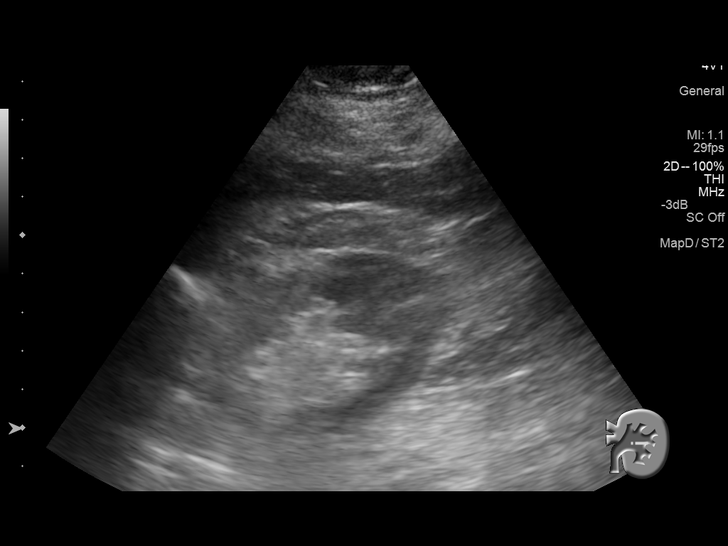
[im 110/120]
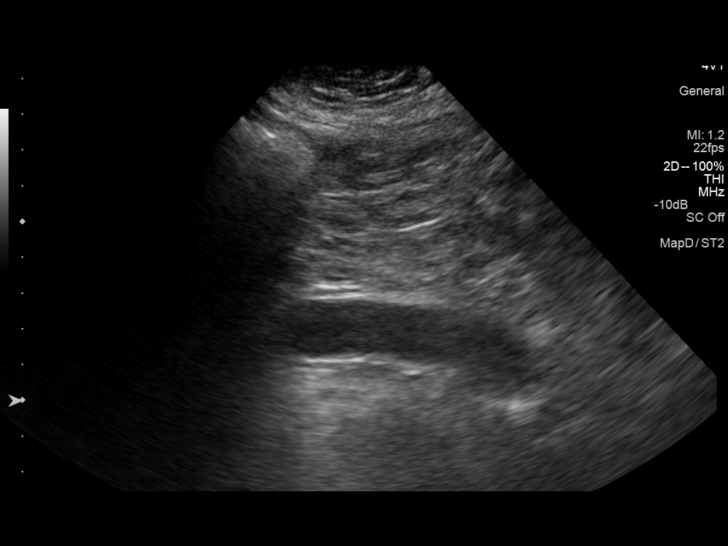
[im 120/120]
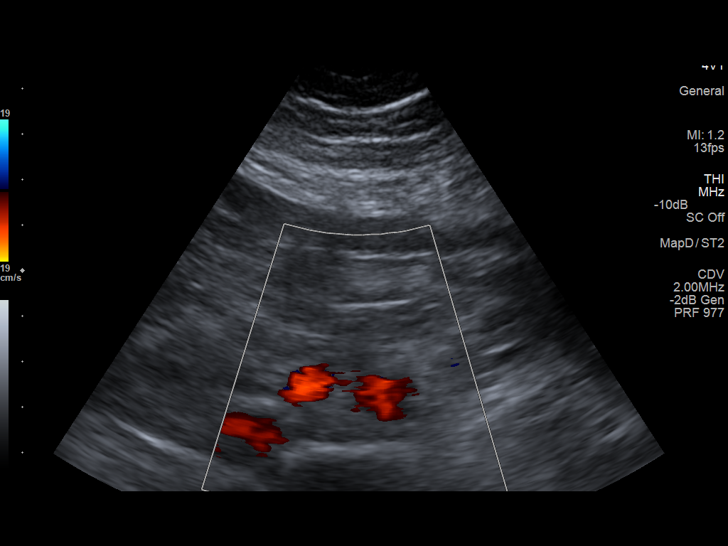

[14 of 25 positions shown; findings below may reference images not displayed]

FINDINGS: Gallbladder: The gallbladder has previously been resected. There is
no pain over the right upper quadrant with compression.

Common bile duct: Diameter: Bowel gas obscures much of the common
bile duct, with the best measurement being 5.4 mm.

Liver: The liver is diffusely echogenic consistent with fatty
infiltration. No focal hepatic abnormality is seen.

IVC: No abnormality visualized.

Pancreas: Portions of the pancreas are obscured by bowel gas with
the tail not well seen.

Spleen: The spleen measures 10.2 cm.

Right Kidney: Length: 10.8 cm..  No hydronephrosis is seen.

Left Kidney: Length: 11.9 cm..  No hydronephrosis is noted.

Abdominal aorta: Bowel gas obscures the abdominal aorta but no focal
aneurysm is seen.

Other findings: None.
IMPRESSION: 1. Echogenic liver parenchyma consistent with fatty infiltration. No
focal abnormality.
2. Prior cholecystectomy.
3. Bowel gas obscures much of the anatomy with portions of the
pancreas and abdominal aorta are obscured.

## 2016-07-30 HISTORY — PX: CATARACT EXTRACTION, BILATERAL: SHX1313

## 2016-09-27 DIAGNOSIS — R7301 Impaired fasting glucose: Secondary | ICD-10-CM | POA: Diagnosis not present

## 2016-09-27 DIAGNOSIS — E782 Mixed hyperlipidemia: Secondary | ICD-10-CM | POA: Diagnosis not present

## 2016-10-01 DIAGNOSIS — E6609 Other obesity due to excess calories: Secondary | ICD-10-CM | POA: Diagnosis not present

## 2016-10-01 DIAGNOSIS — Z0000 Encounter for general adult medical examination without abnormal findings: Secondary | ICD-10-CM | POA: Diagnosis not present

## 2016-10-01 DIAGNOSIS — R7301 Impaired fasting glucose: Secondary | ICD-10-CM | POA: Diagnosis not present

## 2016-10-01 DIAGNOSIS — Z23 Encounter for immunization: Secondary | ICD-10-CM | POA: Diagnosis not present

## 2016-10-01 DIAGNOSIS — M79601 Pain in right arm: Secondary | ICD-10-CM | POA: Diagnosis not present

## 2016-11-19 ENCOUNTER — Telehealth: Payer: Self-pay | Admitting: General Practice

## 2016-11-19 NOTE — Telephone Encounter (Signed)
Patient called in stating she received a letter to schedule her first time screening tcs.  She is not having any problems/not taking any blood thinners and can be reached at [phone] or [phone].

## 2016-11-20 ENCOUNTER — Other Ambulatory Visit: Payer: Self-pay

## 2016-11-20 MED ORDER — PEG 3350-KCL-NA BICARB-NACL 420 G PO SOLR: 4000.0000 mL | ORAL | 0 refills | Status: DC

## 2016-11-20 NOTE — Telephone Encounter (Signed)
Pt will call back around 1:00 pm

## 2016-11-20 NOTE — Telephone Encounter (Signed)
Gastroenterology Pre-Procedure Review  Request Date: Requesting Physician:   PATIENT REVIEW QUESTIONS: The patient responded to the following health history questions as indicated:    1. Diabetes Melitis: NO 2. Joint replacements in the past 12 months: NO 3. Major health problems in the past 3 months: NO 4. Has an artificial valve or MVP: NO 5. Has a defibrillator: NO 6. Has been advised in past to take antibiotics in advance of a procedure like teeth cleaning: NO 7. Family history of colon cancer: NO 8. Alcohol Use: NO 9. History of sleep apnea: NO 10. History of coronary artery or other vascular stents placed within the last 12 months: NO    MEDICATIONS & ALLERGIES:    Patient reports the following regarding taking any blood thinners:   Plavix? NO Aspirin? NO Coumadin? NO Brilinta? NO Xarelto? NO Eliquis? NO Pradaxa? NO Savaysa? NO Effient? NO  Patient confirms/reports the following medications:  No current outpatient prescriptions on file.   No current facility-administered medications for this visit.     Patient confirms/reports the following allergies:  No Known Allergies  No orders of the defined types were placed in this encounter.   AUTHORIZATION INFORMATION Primary Insurance: MEDICARE,  ID #: [id number],  Group #:  Pre-Cert / Auth required:  Pre-Cert / Auth #:   Secondary Insurance: Tamela Oddi,  [place] #: [id number],  Group #: [id number] Pre-Cert / Auth required:  Pre-Cert / Auth #:   SCHEDULE INFORMATION: Procedure has been scheduled as follows:  Date: , Time:   Location:   This Gastroenterology Pre-Precedure Review Form is being routed to the following provider(s):  Dr.ROURK

## 2016-11-20 NOTE — Telephone Encounter (Signed)
Appropriate.

## 2016-11-21 ENCOUNTER — Other Ambulatory Visit: Payer: Self-pay

## 2016-11-21 DIAGNOSIS — Z1211 Encounter for screening for malignant neoplasm of colon: Secondary | ICD-10-CM

## 2016-11-21 NOTE — Telephone Encounter (Signed)
Pt is set up for TCS on 12/27/16 @ 9:30 am. She is aware of date and time. Instructions are going out in the mail. NO PA is needed for TCS.

## 2016-12-27 ENCOUNTER — Ambulatory Visit (HOSPITAL_COMMUNITY)
Admission: RE | Admit: 2016-12-27 | Discharge: 2016-12-27 | Disposition: A | Discharge status: Home / Self Care | Payer: Medicare Other | Source: Ambulatory Visit | Attending: Internal Medicine | Admitting: Internal Medicine

## 2016-12-27 ENCOUNTER — Encounter (HOSPITAL_COMMUNITY)
Admission: RE | Disposition: A | Discharge status: Home / Self Care | Payer: Self-pay | Source: Ambulatory Visit | Attending: Internal Medicine

## 2016-12-27 ENCOUNTER — Encounter (HOSPITAL_COMMUNITY): Payer: Self-pay

## 2016-12-27 DIAGNOSIS — Z1212 Encounter for screening for malignant neoplasm of rectum: Secondary | ICD-10-CM | POA: Diagnosis not present

## 2016-12-27 DIAGNOSIS — D122 Benign neoplasm of ascending colon: Secondary | ICD-10-CM | POA: Diagnosis not present

## 2016-12-27 DIAGNOSIS — Z1211 Encounter for screening for malignant neoplasm of colon: Secondary | ICD-10-CM | POA: Diagnosis not present

## 2016-12-27 DIAGNOSIS — K5730 Diverticulosis of large intestine without perforation or abscess without bleeding: Secondary | ICD-10-CM | POA: Insufficient documentation

## 2016-12-27 HISTORY — PX: COLONOSCOPY: SHX5424

## 2016-12-27 SURGERY — COLONOSCOPY
Anesthesia: Moderate Sedation

## 2016-12-27 MED ORDER — SODIUM CHLORIDE 0.9 % IV SOLN
INTRAVENOUS | Status: DC
  Administered 2016-12-27: 09:00:00 via INTRAVENOUS

## 2016-12-27 MED ORDER — ONDANSETRON HCL 4 MG/2ML IJ SOLN
INTRAMUSCULAR | Status: DC | PRN
  Administered 2016-12-27: 4 mg via INTRAVENOUS

## 2016-12-27 MED ORDER — MIDAZOLAM HCL 5 MG/5ML IJ SOLN
INTRAMUSCULAR | Status: DC | PRN
  Administered 2016-12-27: 1 mg via INTRAVENOUS
  Administered 2016-12-27: 2 mg via INTRAVENOUS
  Administered 2016-12-27 (×2): 1 mg via INTRAVENOUS

## 2016-12-27 MED ORDER — MEPERIDINE HCL 100 MG/ML IJ SOLN
INTRAMUSCULAR | Status: DC | PRN
  Administered 2016-12-27: 25 mg via INTRAVENOUS
  Administered 2016-12-27: 50 mg via INTRAVENOUS
  Administered 2016-12-27: 25 mg via INTRAVENOUS

## 2016-12-27 MED ORDER — MEPERIDINE HCL 100 MG/ML IJ SOLN
INTRAMUSCULAR | Status: DC
Start: 2016-12-27 — End: 2016-12-27
  Filled 2016-12-27: qty 2

## 2016-12-27 MED ORDER — STERILE WATER FOR IRRIGATION IR SOLN
Status: DC | PRN
  Administered 2016-12-27: 09:00:00

## 2016-12-27 MED ORDER — ONDANSETRON HCL 4 MG/2ML IJ SOLN
INTRAMUSCULAR | Status: AC
  Filled 2016-12-27: qty 2

## 2016-12-27 MED ORDER — MIDAZOLAM HCL 5 MG/5ML IJ SOLN
INTRAMUSCULAR | Status: DC
Start: 2016-12-27 — End: 2016-12-27
  Filled 2016-12-27: qty 10

## 2016-12-27 NOTE — H&P (Signed)
@  UJWJ@   Primary Care Physician:  Celene Squibb, MD Primary Gastroenterologist:  Dr. Gala Romney  Pre-Procedure History & Physical: HPI:  Angelica White is a 66 y.o. female is here for a screening colonoscopy. No prior colonoscopy. No bowel symptoms. No family history of colon cancer many first relatives.  No past medical history on file.  No past surgical history on file.  Prior to Admission medications   Medication Sig Start Date End Date Taking? Authorizing Provider  ibuprofen (ADVIL,MOTRIN) 200 MG tablet Take 600 mg by mouth every 8 (eight) hours as needed for moderate pain.   Yes [provider]    Allergies as of 11/21/2016  . (No Known Allergies)    No family history on file.  Social History   Social History  . Marital status: Married    Spouse name: N/A  . Number of children: N/A  . Years of education: N/A   Occupational History  . Not on file.   Social History Main Topics  . Smoking status: Not on file  . Smokeless tobacco: Not on file  . Alcohol use Not on file  . Drug use: Unknown  . Sexual activity: Not on file   Other Topics Concern  . Not on file   Social History Narrative  . No narrative on file    Review of Systems: See HPI, otherwise negative ROS  Physical Exam: BP 111/65   Pulse 88   Temp 98.1 F (36.7 C) (Oral)   Resp 16   Ht 5\' 2"  (1.575 m)   Wt 189 lb (85.7 kg)   SpO2 97%   BMI 34.57 kg/m  General:   Alert,  Well-developed, well-nourished, pleasant and cooperative in NAD Lungs:  Clear throughout to auscultation.   No wheezes, crackles, or rhonchi. No acute distress. Heart:  Regular rate and rhythm; no murmurs, clicks, rubs,  or gallops. Abdomen:  Soft, nontender and nondistended. No masses, hepatosplenomegaly or hernias noted. Normal bowel sounds, without guarding, and without rebound.     Impression/Plan: Angelica White is now here to undergo a screening colonoscopy.  First ever average risk screening examination. The  risks, benefits, limitations, alternatives and imponderables have been reviewed with the patient. Questions have been answered. All parties are agreeable.      Risks, benefits, limitations, imponderables and alternatives regarding colonoscopy have been reviewed with the patient. Questions have been answered. All parties agreeable.     Notice:  This dictation was prepared with Dragon dictation along with smaller phrase technology. Any transcriptional errors that result from this process are unintentional and may not be corrected upon review.

## 2016-12-27 NOTE — Discharge Instructions (Addendum)
Colonoscopy Discharge Instructions  Read the instructions outlined below and refer to this sheet in the next few weeks. These discharge instructions provide you with general information on caring for yourself after you leave the hospital. Your doctor may also give you specific instructions. While your treatment has been planned according to the most current medical practices available, unavoidable complications occasionally occur. If you have any problems or questions after discharge, call Dr. Gala Romney at [phone]. ACTIVITY  You may resume your regular activity, but move at a slower pace for the next 24 hours.   Take frequent rest periods for the next 24 hours.   Walking will help get rid of the air and reduce the bloated feeling in your belly (abdomen).   No driving for 24 hours (because of the medicine (anesthesia) used during the test).    Do not sign any important legal documents or operate any machinery for 24 hours (because of the anesthesia used during the test).  NUTRITION  Drink plenty of fluids.   You may resume your normal diet as instructed by your doctor.   Begin with a light meal and progress to your normal diet. Heavy or fried foods are harder to digest and may make you feel sick to your stomach (nauseated).   Avoid alcoholic beverages for 24 hours or as instructed.  MEDICATIONS  You may resume your normal medications unless your doctor tells you otherwise.  WHAT YOU CAN EXPECT TODAY  Some feelings of bloating in the abdomen.   Passage of more gas than usual.   Spotting of blood in your stool or on the toilet paper.  IF YOU HAD POLYPS REMOVED DURING THE COLONOSCOPY:  No aspirin products for 7 days or as instructed.   No alcohol for 7 days or as instructed.   Eat a soft diet for the next 24 hours.  FINDING OUT THE RESULTS OF YOUR TEST Not all test results are available during your visit. If your test results are not back during the visit, make an appointment  with your caregiver to find out the results. Do not assume everything is normal if you have not heard from your caregiver or the medical facility. It is important for you to follow up on all of your test results.  SEEK IMMEDIATE MEDICAL ATTENTION IF:  You have more than a spotting of blood in your stool.   Your belly is swollen (abdominal distention).   You are nauseated or vomiting.   You have a temperature over 101.   You have abdominal pain or discomfort that is severe or gets worse throughout the day.     Colon diverticulosis and polyp information provided  Further recommendations to follow pending review of pathology report   Colon Polyps Polyps are tissue growths inside the body. Polyps can grow in many places, including the large intestine (colon). A polyp may be a round bump or a mushroom-shaped growth. You could have one polyp or several. Most colon polyps are noncancerous (benign). However, some colon polyps can become cancerous over time. What are the causes? The exact cause of colon polyps is not known. What increases the risk? This condition is more likely to develop in people who:  Have a family history of colon cancer or colon polyps.  Are older than 89 or older than 45 if they are African American.  Have inflammatory bowel disease, such as ulcerative colitis or Crohn disease.  Are overweight.  Smoke cigarettes.  Do not get enough exercise.  Drink  too much alcohol.  Eat a diet that is: ? High in fat and red meat. ? Low in fiber.  Had childhood cancer that was treated with abdominal radiation.  What are the signs or symptoms? Most polyps do not cause symptoms. If you have symptoms, they may include:  Blood coming from your rectum when having a bowel movement.  Blood in your stool.The stool may look dark red or black.  A change in bowel habits, such as constipation or diarrhea.  How is this diagnosed? This condition is diagnosed with a  colonoscopy. This is a procedure that uses a lighted, flexible scope to look at the inside of your colon. How is this treated? Treatment for this condition involves removing any polyps that are found. Those polyps will then be tested for cancer. If cancer is found, your health care provider will talk to you about options for colon cancer treatment. Follow these instructions at home: Diet  Eat plenty of fiber, such as fruits, vegetables, and whole grains.  Eat foods that are high in calcium and vitamin D, such as milk, cheese, yogurt, eggs, liver, fish, and broccoli.  Limit foods high in fat, red meats, and processed meats, such as hot dogs, sausage, bacon, and lunch meats.  Maintain a healthy weight, or lose weight if recommended by your health care provider. General instructions  Do not smoke cigarettes.  Do not drink alcohol excessively.  Keep all follow-up visits as told by your health care provider. This is important. This includes keeping regularly scheduled colonoscopies. Talk to your health care provider about when you need a colonoscopy.  Exercise every day or as told by your health care provider. Contact a health care provider if:  You have new or worsening bleeding during a bowel movement.  You have new or increased blood in your stool.  You have a change in bowel habits.  You unexpectedly lose weight. This information is not intended to replace advice given to you by your health care provider. Make sure you discuss any questions you have with your health care provider. Document Released: 04/11/2004 Document Revised: 12/22/2015 Document Reviewed: 06/06/2015 Elsevier Interactive Patient Education  [employer].  Diverticulosis Diverticulosis is a condition that develops when small pouches (diverticula) form in the wall of the large intestine (colon). The colon is where water is absorbed and stool is formed. The pouches form when the inside layer of the colon pushes  through weak spots in the outer layers of the colon. You may have a few pouches or many of them. What are the causes? The cause of this condition is not known. What increases the risk? The following factors may make you more likely to develop this condition:  Being older than age 51. Your risk for this condition increases with age. Diverticulosis is rare among people younger than age 3. By age 59, many people have it.  Eating a low-fiber diet.  Having frequent constipation.  Being overweight.  Not getting enough exercise.  Smoking.  Taking over-the-counter pain medicines, like aspirin and ibuprofen.  Having a family history of diverticulosis.  What are the signs or symptoms? In most people, there are no symptoms of this condition. If you do have symptoms, they may include:  Bloating.  Cramps in the abdomen.  Constipation or diarrhea.  Pain in the lower left side of the abdomen.  How is this diagnosed? This condition is most often diagnosed during an exam for other colon problems. Because diverticulosis usually has no  symptoms, it often cannot be diagnosed independently. This condition may be diagnosed by:  Using a flexible scope to examine the colon (colonoscopy).  Taking an X-ray of the colon after dye has been put into the colon (barium enema).  Doing a CT scan.  How is this treated? You may not need treatment for this condition if you have never developed an infection related to diverticulosis. If you have had an infection before, treatment may include:  Eating a high-fiber diet. This may include eating more fruits, vegetables, and grains.  Taking a fiber supplement.  Taking a live bacteria supplement (probiotic).  Taking medicine to relax your colon.  Taking antibiotic medicines.  Follow these instructions at home:  Drink 6-8 glasses of water or more each day to prevent constipation.  Try not to strain when you have a bowel movement.  If you have had  an infection before: ? Eat more fiber as directed by your health care provider or your diet and nutrition specialist (dietitian). ? Take a fiber supplement or probiotic, if your health care provider approves.  Take over-the-counter and prescription medicines only as told by your health care provider.  If you were prescribed an antibiotic, take it as told by your health care provider. Do not stop taking the antibiotic even if you start to feel better.  Keep all follow-up visits as told by your health care provider. This is important. Contact a health care provider if:  You have pain in your abdomen.  You have bloating.  You have cramps.  You have not had a bowel movement in 3 days. Get help right away if:  Your pain gets worse.  Your bloating becomes very bad.  You have a fever or chills, and your symptoms suddenly get worse.  You vomit.  You have bowel movements that are bloody or black.  You have bleeding from your rectum. Summary  Diverticulosis is a condition that develops when small pouches (diverticula) form in the wall of the large intestine (colon).  You may have a few pouches or many of them.  This condition is most often diagnosed during an exam for other colon problems.  If you have had an infection related to diverticulosis, treatment may include increasing the fiber in your diet, taking supplements, or taking medicines. This information is not intended to replace advice given to you by your health care provider. Make sure you discuss any questions you have with your health care provider. Document Released: 04/12/2004 Document Revised: 06/04/2016 Document Reviewed: 06/04/2016 Elsevier Interactive Patient Education  2017 [employer].

## 2016-12-27 NOTE — Op Note (Signed)
[facility] Patient Name: Angelica White Procedure Date: 12/27/2016 8:51 AM MRN: [id number] Date of Birth: 03/01/1951 Attending MD: Norvel Richards , MD CSN: [id number] Age: 66 Admit Type: Outpatient Procedure:                Colonoscopy Indications:              Screening for colorectal malignant neoplasm Providers:                Norvel Richards, MD, Hinton Rao, RN, Aram Candela Referring MD:              Medicines:                Midazolam 5 mg IV, Meperidine 100 mg IV,                            Ondansetron 4 mg IV Complications:            No immediate complications. Estimated Blood Loss:     Estimated blood loss was minimal. Procedure:                Pre-Anesthesia Assessment:                           - Prior to the procedure, a History and Physical                            was performed, and patient medications and                            allergies were reviewed. The patient's tolerance of                            previous anesthesia was also reviewed. The risks                            and benefits of the procedure and the sedation                            options and risks were discussed with the patient.                            All questions were answered, and informed consent                            was obtained. Prior Anticoagulants: The patient has                            taken no previous anticoagulant or antiplatelet                            agents. ASA Grade Assessment: II - A patient with  mild systemic disease. After reviewing the risks                            and benefits, the patient was deemed in                            satisfactory condition to undergo the procedure.                           After obtaining informed consent, the colonoscope                            was passed under direct vision. Throughout the                            procedure, the patient's  blood pressure, pulse, and                            oxygen saturations were monitored continuously. The                            EC-[id number] ([id number]) scope was introduced through                            the anus and advanced to the the cecum, identified                            by appendiceal orifice and ileocecal valve. The                            colonoscopy was performed without difficulty. The                            patient tolerated the procedure well. The quality                            of the bowel preparation was adequate. The                            ileocecal valve, appendiceal orifice, and rectum                            were photographed. Scope In: 9:23:04 AM Scope Out: 9:39:28 AM Scope Withdrawal Time: 0 hours 8 minutes 9 seconds  Total Procedure Duration: 0 hours 16 minutes 24 seconds  Findings:      The perianal and digital rectal examinations were normal.      Scattered small and large-mouthed diverticula were found in the entire       colon.      A 2 mm polyp was found in the ascending colon. The polyp was sessile.       This was biopsied with a cold forceps for histology. Estimated blood       loss was minimal.      The exam was otherwise without abnormality on direct and retroflexion  views. Impression:               - Diverticulosis in the entire examined colon.                           - One 2 mm polyp in the ascending colon. Biopsied.                           - The examination was otherwise normal on direct                            and retroflexion views. Moderate Sedation:      Moderate (conscious) sedation was administered by the endoscopy nurse       and supervised by the endoscopist. The following parameters were       monitored: oxygen saturation, heart rate, blood pressure, respiratory       rate, EKG, adequacy of pulmonary ventilation, and response to care.       Total physician intraservice time was 23  minutes. Recommendation:           - Patient has a contact number available for                            emergencies. The signs and symptoms of potential                            delayed complications were discussed with the                            patient. Return to normal activities tomorrow.                            Written discharge instructions were provided to the                            patient.                           - Resume previous diet.                           - Repeat colonoscopy date to be determined after                            pending pathology results are reviewed for                            surveillance based on pathology results.                           - Return to GI clinic (date not yet determined). Procedure Code(s):        --- Professional ---                           [phone], Colonoscopy, flexible; with biopsy, single  or multiple                           99152, Moderate sedation services provided by the                            same physician or other qualified health care                            professional performing the diagnostic or                            therapeutic service that the sedation supports,                            requiring the presence of an independent trained                            observer to assist in the monitoring of the                            patient's level of consciousness and physiological                            status; initial 15 minutes of intraservice time,                            patient age 74 years or older                           [phone], Moderate sedation services; each additional                            15 minutes intraservice time Diagnosis Code(s):        --- Professional ---                           Z12.11, Encounter for screening for malignant                            neoplasm of colon                           D12.2, Benign neoplasm of  ascending colon                           K57.30, Diverticulosis of large intestine without                            perforation or abscess without bleeding CPT copyright 2016 American Medical Association. All rights reserved. The codes documented in this report are preliminary and upon coder review may  be revised to meet current compliance requirements. Cristopher Estimable. Delron Comer, MD Norvel Richards, MD 12/27/2016 9:45:23 AM This report has been signed electronically. Number of Addenda: 0

## 2016-12-28 ENCOUNTER — Encounter: Payer: Self-pay | Admitting: Internal Medicine

## 2016-12-28 ENCOUNTER — Encounter (HOSPITAL_COMMUNITY): Payer: Self-pay | Admitting: Internal Medicine

## 2017-02-11 DIAGNOSIS — H2513 Age-related nuclear cataract, bilateral: Secondary | ICD-10-CM | POA: Diagnosis not present

## 2017-02-27 ENCOUNTER — Other Ambulatory Visit (HOSPITAL_COMMUNITY): Payer: Self-pay | Admitting: Internal Medicine

## 2017-02-27 DIAGNOSIS — Z1231 Encounter for screening mammogram for malignant neoplasm of breast: Secondary | ICD-10-CM

## 2017-03-18 ENCOUNTER — Ambulatory Visit (HOSPITAL_COMMUNITY)
Admission: RE | Admit: 2017-03-18 | Discharge: 2017-03-18 | Disposition: A | Discharge status: Home / Self Care | Payer: Medicare Other | Source: Ambulatory Visit | Attending: Internal Medicine | Admitting: Internal Medicine

## 2017-03-18 DIAGNOSIS — Z1231 Encounter for screening mammogram for malignant neoplasm of breast: Secondary | ICD-10-CM | POA: Diagnosis not present

## 2017-03-19 DIAGNOSIS — H2512 Age-related nuclear cataract, left eye: Secondary | ICD-10-CM | POA: Diagnosis not present

## 2017-04-03 ENCOUNTER — Ambulatory Visit (HOSPITAL_COMMUNITY)
Admission: RE | Admit: 2017-04-03 | Discharge: 2017-04-03 | Disposition: A | Discharge status: Home / Self Care | Payer: Medicare Other | Source: Ambulatory Visit | Attending: Internal Medicine | Admitting: Internal Medicine

## 2017-04-03 ENCOUNTER — Other Ambulatory Visit (HOSPITAL_COMMUNITY): Payer: Self-pay | Admitting: Internal Medicine

## 2017-04-03 DIAGNOSIS — R1030 Lower abdominal pain, unspecified: Secondary | ICD-10-CM | POA: Diagnosis not present

## 2017-04-03 DIAGNOSIS — M79605 Pain in left leg: Secondary | ICD-10-CM | POA: Diagnosis not present

## 2017-04-03 DIAGNOSIS — M79662 Pain in left lower leg: Secondary | ICD-10-CM | POA: Diagnosis not present

## 2017-04-09 DIAGNOSIS — M79675 Pain in left toe(s): Secondary | ICD-10-CM | POA: Diagnosis not present

## 2017-04-09 DIAGNOSIS — H259 Unspecified age-related cataract: Secondary | ICD-10-CM | POA: Diagnosis not present

## 2017-04-09 DIAGNOSIS — Z6836 Body mass index (BMI) 36.0-36.9, adult: Secondary | ICD-10-CM | POA: Diagnosis not present

## 2017-04-15 DIAGNOSIS — Z853 Personal history of malignant neoplasm of breast: Secondary | ICD-10-CM | POA: Diagnosis not present

## 2017-04-15 DIAGNOSIS — Z9221 Personal history of antineoplastic chemotherapy: Secondary | ICD-10-CM | POA: Diagnosis not present

## 2017-04-15 DIAGNOSIS — H2512 Age-related nuclear cataract, left eye: Secondary | ICD-10-CM | POA: Diagnosis not present

## 2017-04-15 DIAGNOSIS — H269 Unspecified cataract: Secondary | ICD-10-CM | POA: Diagnosis not present

## 2017-04-30 DIAGNOSIS — H2511 Age-related nuclear cataract, right eye: Secondary | ICD-10-CM | POA: Diagnosis not present

## 2017-05-03 DIAGNOSIS — M25559 Pain in unspecified hip: Secondary | ICD-10-CM | POA: Diagnosis not present

## 2017-05-03 DIAGNOSIS — Z6837 Body mass index (BMI) 37.0-37.9, adult: Secondary | ICD-10-CM | POA: Diagnosis not present

## 2017-05-03 DIAGNOSIS — Z0000 Encounter for general adult medical examination without abnormal findings: Secondary | ICD-10-CM | POA: Diagnosis not present

## 2017-05-06 DIAGNOSIS — M25552 Pain in left hip: Secondary | ICD-10-CM | POA: Diagnosis not present

## 2017-05-06 DIAGNOSIS — M1612 Unilateral primary osteoarthritis, left hip: Secondary | ICD-10-CM | POA: Diagnosis not present

## 2017-05-08 DIAGNOSIS — M25552 Pain in left hip: Secondary | ICD-10-CM | POA: Diagnosis not present

## 2017-05-13 DIAGNOSIS — Z853 Personal history of malignant neoplasm of breast: Secondary | ICD-10-CM | POA: Diagnosis not present

## 2017-05-13 DIAGNOSIS — Z9221 Personal history of antineoplastic chemotherapy: Secondary | ICD-10-CM | POA: Diagnosis not present

## 2017-05-13 DIAGNOSIS — M1990 Unspecified osteoarthritis, unspecified site: Secondary | ICD-10-CM | POA: Diagnosis not present

## 2017-05-13 DIAGNOSIS — H2511 Age-related nuclear cataract, right eye: Secondary | ICD-10-CM | POA: Diagnosis not present

## 2017-05-13 DIAGNOSIS — Z9842 Cataract extraction status, left eye: Secondary | ICD-10-CM | POA: Diagnosis not present

## 2017-05-13 DIAGNOSIS — Z961 Presence of intraocular lens: Secondary | ICD-10-CM | POA: Diagnosis not present

## 2017-06-05 DIAGNOSIS — M25552 Pain in left hip: Secondary | ICD-10-CM | POA: Diagnosis not present

## 2017-06-05 DIAGNOSIS — M1612 Unilateral primary osteoarthritis, left hip: Secondary | ICD-10-CM | POA: Diagnosis not present

## 2017-07-27 NOTE — H&P (Signed)
TOTAL HIP ADMISSION H&P  Patient is admitted for left total hip arthroplasty, anterior approach.  Subjective:  Chief Complaint:   Left hip primary OA / pain  HPI: Angelica White, 66 y.o. female, has a history of pain and functional disability in the left hip(s) due to arthritis and patient has failed non-surgical conservative treatments for greater than 12 weeks to include NSAID's and/or analgesics, use of assistive devices and activity modification.  Onset of symptoms was gradual starting <1 years ago with rapidlly worsening course since that time.The patient noted no past surgery on the left hip(s).  Patient currently rates pain in the left hip at 10 out of 10 with activity. Patient has night pain, worsening of pain with activity and weight bearing, trendelenberg gait, pain that interfers with activities of daily living and pain with passive range of motion. Patient has evidence of periarticular osteophytes and joint space narrowing by imaging studies. This condition presents safety issues increasing the risk of falls.  There is no current active infection.  Risks, benefits and expectations were discussed with the patient.  Risks including but not limited to the risk of anesthesia, blood clots, nerve damage, blood vessel damage, failure of the prosthesis, infection and up to and including death.  Patient understand the risks, benefits and expectations and wishes to proceed with surgery.   PCP: Celene Squibb, MD  D/C Plans:       Home  Post-op Meds:       No Rx given   Tranexamic Acid:      To be given -   Topically (hx of DVT)  Decadron:      Is to be given  FYI:     Xarelto then ASA  (hx of DVT)  Norco  DME:  Rx given for - RW, 3-n-1 and shower chair  PT:  No PT     Past Medical History:  Diagnosis Date  . Cancer [facility]) 2003   Breast cancer    Past Surgical History:  Procedure Laterality Date  . AUGMENTATION MAMMAPLASTY  2003  . BREAST SURGERY  2003  . CATARACT EXTRACTION,  BILATERAL  2018  . CHOLECYSTECTOMY    . COLONOSCOPY N/A 12/27/2016   Procedure: COLONOSCOPY;  Surgeon: Daneil Dolin, MD;  Location: [facility] ENDO SUITE;  Service: Endoscopy;  Laterality: N/A;  930   . LIPOSUCTION  2003    No current facility-administered medications for this encounter.    Current Outpatient Medications  Medication Sig Dispense Refill Last Dose  . diphenhydramine-acetaminophen (TYLENOL PM) 25-500 MG TABS tablet Take 1 tablet by mouth at bedtime as needed (sleep).     Marland Kitchen ibuprofen (ADVIL,MOTRIN) 800 MG tablet Take 800 mg by mouth 2 (two) times daily.      Allergies  Allergen Reactions  . Amoxicillin-Pot Clavulanate Hives and Rash    Social History   Tobacco Use  . Smoking status: Never Smoker  . Smokeless tobacco: Never Used  Substance Use Topics  . Alcohol use: No    Frequency: Never       Review of Systems  Constitutional: Negative.   HENT: Negative.   Eyes: Negative.   Respiratory: Negative.   Cardiovascular: Negative.   Gastrointestinal: Negative.   Genitourinary: Negative.   Musculoskeletal: Positive for joint pain.  Skin: Negative.   Neurological: Negative.   Endo/Heme/Allergies: Negative.   Psychiatric/Behavioral: Negative.     Objective:  Physical Exam  Constitutional: She is oriented to person, place, and time. She appears well-developed.  HENT:  Head: Normocephalic.  Mouth/Throat: She has dentures.  Eyes: Pupils are equal, round, and reactive to light.  Neck: Neck supple. No JVD present. No tracheal deviation present. No thyromegaly present.  Cardiovascular: Normal rate, regular rhythm and intact distal pulses.  Respiratory: Effort normal and breath sounds normal. No respiratory distress. She has no wheezes.  GI: Soft. There is no tenderness. There is no guarding.  Musculoskeletal:       Left hip: She exhibits decreased range of motion, decreased strength, tenderness and bony tenderness. She exhibits no swelling, no deformity and no  laceration.  Lymphadenopathy:    She has no cervical adenopathy.  Neurological: She is alert and oriented to person, place, and time.  Skin: Skin is warm and dry.  Psychiatric: She has a normal mood and affect.     Labs:  Estimated body mass index is 36.49 kg/m as calculated from the following:   Height as of 07/31/17: 5\' 2"  (1.575 m).   Weight as of 07/31/17: 90.5 kg (199 lb 8 oz).   Imaging Review Plain radiographs demonstrate severe degenerative joint disease of the left hip(s). The bone quality appears to be good for age and reported activity level.  Assessment/Plan:  End stage arthritis, left hip(s)  The patient history, physical examination, clinical judgement of the provider and imaging studies are consistent with end stage degenerative joint disease of the left hip(s) and total hip arthroplasty is deemed medically necessary. The treatment options including medical management, injection therapy, arthroscopy and arthroplasty were discussed at length. The risks and benefits of total hip arthroplasty were presented and reviewed. The risks due to aseptic loosening, infection, stiffness, dislocation/subluxation,  thromboembolic complications and other imponderables were discussed.  The patient acknowledged the explanation, agreed to proceed with the plan and consent was signed. Patient is being admitted for inpatient treatment for surgery, pain control, PT, OT, prophylactic antibiotics, VTE prophylaxis, progressive ambulation and ADL's and discharge planning.The patient is planning to be discharged home.    West Pugh Yehuda Printup   PA-C  08/05/2017, 2:46 PM

## 2017-07-29 NOTE — Patient Instructions (Addendum)
Angelica White  07/29/2017   Your procedure is scheduled on: 08-06-17  Report to [facility]  Entrance Report to Admitting at 9:00 AM   Call this number if you have problems the morning of surgery [phone]    Do not eat food or drink liquids :After Midnight.    Take these medicines the morning of surgery with A SIP OF WATER: None                               You may not have any metal on your body including hair pins and              piercings  Do not wear jewelry, make-up, lotions, powders or perfumes, deodorant             Men may shave face and neck.   Do not bring valuables to the hospital. [place].  Contacts, dentures or bridgework may not be worn into surgery.  Leave suitcase in the car. After surgery it may be brought to your room.                 Please read over the following fact sheets you were given: _____________________________________________________________________          [facility] - Preparing for Surgery Before surgery, you can play an important role.  Because skin is not sterile, your skin needs to be as free of germs as possible.  You can reduce the number of germs on your skin by washing with CHG (chlorahexidine gluconate) soap before surgery.  CHG is an antiseptic cleaner which kills germs and bonds with the skin to continue killing germs even after washing. Please DO NOT use if you have an allergy to CHG or antibacterial soaps.  If your skin becomes reddened/irritated stop using the CHG and inform your nurse when you arrive at Short Stay. Do not shave (including legs and underarms) for at least 48 hours prior to the first CHG shower.  You may shave your face/neck. Please follow these instructions carefully:  1.  Shower with CHG Soap the night before surgery and the  morning of Surgery.  2.  If you choose to wash your hair, wash your hair first as usual with your  normal   shampoo.  3.  After you shampoo, rinse your hair and body thoroughly to remove the  shampoo.                           4.  Use CHG as you would any other liquid soap.  You can apply chg directly  to the skin and wash                       Gently with a scrungie or clean washcloth.  5.  Apply the CHG Soap to your body ONLY FROM THE NECK DOWN.   Do not use on face/ open                           Wound or open sores. Avoid contact with eyes, ears mouth and genitals (private parts).  Wash face,  Genitals (private parts) with your normal soap.             6.  Wash thoroughly, paying special attention to the area where your surgery  will be performed.  7.  Thoroughly rinse your body with warm water from the neck down.  8.  DO NOT shower/wash with your normal soap after using and rinsing off  the CHG Soap.                9.  Pat yourself dry with a clean towel.            10.  Wear clean pajamas.            11.  Place clean sheets on your bed the night of your first shower and do not  sleep with pets. Day of Surgery : Do not apply any lotions/deodorants the morning of surgery.  Please wear clean clothes to the hospital/surgery center.  FAILURE TO FOLLOW THESE INSTRUCTIONS MAY RESULT IN THE CANCELLATION OF YOUR SURGERY PATIENT SIGNATURE_________________________________  NURSE SIGNATURE__________________________________  ________________________________________________________________________   Adam Phenix  An incentive spirometer is a tool that can help keep your lungs clear and active. This tool measures how well you are filling your lungs with each breath. Taking long deep breaths may help reverse or decrease the chance of developing breathing (pulmonary) problems (especially infection) following:  A long period of time when you are unable to move or be active. BEFORE THE PROCEDURE   If the spirometer includes an indicator to show your best effort, your nurse or  respiratory therapist will set it to a desired goal.  If possible, sit up straight or lean slightly forward. Try not to slouch.  Hold the incentive spirometer in an upright position. INSTRUCTIONS FOR USE  1. Sit on the edge of your bed if possible, or sit up as far as you can in bed or on a chair. 2. Hold the incentive spirometer in an upright position. 3. Breathe out normally. 4. Place the mouthpiece in your mouth and seal your lips tightly around it. 5. Breathe in slowly and as deeply as possible, raising the piston or the ball toward the top of the column. 6. Hold your breath for 3-5 seconds or for as long as possible. Allow the piston or ball to fall to the bottom of the column. 7. Remove the mouthpiece from your mouth and breathe out normally. 8. Rest for a few seconds and repeat Steps 1 through 7 at least 10 times every 1-2 hours when you are awake. Take your time and take a few normal breaths between deep breaths. 9. The spirometer may include an indicator to show your best effort. Use the indicator as a goal to work toward during each repetition. 10. After each set of 10 deep breaths, practice coughing to be sure your lungs are clear. If you have an incision (the cut made at the time of surgery), support your incision when coughing by placing a pillow or rolled up towels firmly against it. Once you are able to get out of bed, walk around indoors and cough well. You may stop using the incentive spirometer when instructed by your caregiver.  RISKS AND COMPLICATIONS  Take your time so you do not get dizzy or light-headed.  If you are in pain, you may need to take or ask for pain medication before doing incentive spirometry. It is harder to take a deep breath if you are having pain.  AFTER USE  Rest and breathe slowly and easily.  It can be helpful to keep track of a log of your progress. Your caregiver can provide you with a simple table to help with this. If you are using the  spirometer at home, follow these instructions: [place] IF:   You are having difficultly using the spirometer.  You have trouble using the spirometer as often as instructed.  Your pain medication is not giving enough relief while using the spirometer.  You develop fever of 100.5 F (38.1 C) or higher. SEEK IMMEDIATE MEDICAL CARE IF:   You cough up bloody sputum that had not been present before.  You develop fever of 102 F (38.9 C) or greater.  You develop worsening pain at or near the incision site. MAKE SURE YOU:   Understand these instructions.  Will watch your condition.  Will get help right away if you are not doing well or get worse. Document Released: 11/26/2006 Document Revised: 10/08/2011 Document Reviewed: 01/27/2007 Exit[employer] Patient Information 2014 [employer], [place].   ________________________________________________________________________  WHAT IS A BLOOD TRANSFUSION? Blood Transfusion Information  A transfusion is the replacement of blood or some of its parts. Blood is made up of multiple cells which provide different functions.  Red blood cells carry oxygen and are used for blood loss replacement.  White blood cells fight against infection.  Platelets control bleeding.  Plasma helps clot blood.  Other blood products are available for specialized needs, such as hemophilia or other clotting disorders. BEFORE THE TRANSFUSION  Who gives blood for transfusions?   Healthy volunteers who are fully evaluated to make sure their blood is safe. This is blood bank blood. Transfusion therapy is the safest it has ever been in the practice of medicine. Before blood is taken from a donor, a complete history is taken to make sure that person has no history of diseases nor engages in risky social behavior (examples are intravenous drug use or sexual activity with multiple partners). The donor's travel history is screened to minimize risk of transmitting  infections, such as malaria. The donated blood is tested for signs of infectious diseases, such as HIV and hepatitis. The blood is then tested to be sure it is compatible with you in order to minimize the chance of a transfusion reaction. If you or a relative donates blood, this is often done in anticipation of surgery and is not appropriate for emergency situations. It takes many days to process the donated blood. RISKS AND COMPLICATIONS Although transfusion therapy is very safe and saves many lives, the main dangers of transfusion include:   Getting an infectious disease.  Developing a transfusion reaction. This is an allergic reaction to something in the blood you were given. Every precaution is taken to prevent this. The decision to have a blood transfusion has been considered carefully by your caregiver before blood is given. Blood is not given unless the benefits outweigh the risks. AFTER THE TRANSFUSION  Right after receiving a blood transfusion, you will usually feel much better and more energetic. This is especially true if your red blood cells have gotten low (anemic). The transfusion raises the level of the red blood cells which carry oxygen, and this usually causes an energy increase.  The nurse administering the transfusion will monitor you carefully for complications. HOME CARE INSTRUCTIONS  No special instructions are needed after a transfusion. You may find your energy is better. Speak with your caregiver about any limitations on activity for underlying diseases  you may have. SEEK MEDICAL CARE IF:   Your condition is not improving after your transfusion.  You develop redness or irritation at the intravenous (IV) site. SEEK IMMEDIATE MEDICAL CARE IF:  Any of the following symptoms occur over the next 12 hours:  Shaking chills.  You have a temperature by mouth above 102 F (38.9 C), not controlled by medicine.  Chest, back, or muscle pain.  People around you feel you are  not acting correctly or are confused.  Shortness of breath or difficulty breathing.  Dizziness and fainting.  You get a rash or develop hives.  You have a decrease in urine output.  Your urine turns a dark color or changes to pink, red, or brown. Any of the following symptoms occur over the next 10 days:  You have a temperature by mouth above 102 F (38.9 C), not controlled by medicine.  Shortness of breath.  Weakness after normal activity.  The white part of the eye turns yellow (jaundice).  You have a decrease in the amount of urine or are urinating less often.  Your urine turns a dark color or changes to pink, red, or brown. Document Released: 07/13/2000 Document Revised: 10/08/2011 Document Reviewed: 03/01/2008 [facility] Patient Information 2014 [place], [place].  _______________________________________________________________________

## 2017-07-31 ENCOUNTER — Encounter (HOSPITAL_COMMUNITY): Payer: Self-pay

## 2017-07-31 ENCOUNTER — Other Ambulatory Visit: Payer: Self-pay

## 2017-07-31 ENCOUNTER — Encounter (INDEPENDENT_AMBULATORY_CARE_PROVIDER_SITE_OTHER): Payer: Self-pay

## 2017-07-31 ENCOUNTER — Encounter (HOSPITAL_COMMUNITY)
Admission: RE | Admit: 2017-07-31 | Discharge: 2017-07-31 | Disposition: A | Discharge status: Home / Self Care | Payer: Medicare Other | Source: Ambulatory Visit | Attending: Orthopedic Surgery | Admitting: Orthopedic Surgery

## 2017-07-31 DIAGNOSIS — M1612 Unilateral primary osteoarthritis, left hip: Secondary | ICD-10-CM | POA: Diagnosis not present

## 2017-07-31 DIAGNOSIS — Z01812 Encounter for preprocedural laboratory examination: Secondary | ICD-10-CM | POA: Diagnosis not present

## 2017-07-31 HISTORY — DX: Malignant (primary) neoplasm, unspecified: C80.1

## 2017-07-31 LAB — SURGICAL PCR SCREEN
MRSA, PCR: NEGATIVE
STAPHYLOCOCCUS AUREUS: NEGATIVE

## 2017-07-31 LAB — BASIC METABOLIC PANEL
Anion gap: 6 (ref 5–15)
BUN: 21 mg/dL — AB (ref 6–20)
CALCIUM: 9.7 mg/dL (ref 8.9–10.3)
CO2: 26 mmol/L (ref 22–32)
Chloride: 107 mmol/L (ref 101–111)
Creatinine, Ser: 0.75 mg/dL (ref 0.44–1.00)
GFR calc Af Amer: >60 mL/min (ref >60)
GLUCOSE: 97 mg/dL (ref 65–99)
Potassium: 5.2 mmol/L — ABNORMAL HIGH (ref 3.5–5.1)
Sodium: 139 mmol/L (ref 135–145)

## 2017-07-31 LAB — ABO/RH: ABO/RH(D): A POS

## 2017-07-31 LAB — CBC
HCT: 42.4 % (ref 36.0–46.0)
HEMOGLOBIN: 13.7 g/dL (ref 12.0–15.0)
MCH: 31.5 pg (ref 26.0–34.0)
MCHC: 32.3 g/dL (ref 30.0–36.0)
MCV: 97.5 fL (ref 78.0–100.0)
Platelets: 247 10*3/uL (ref 150–400)
RBC: 4.35 MIL/uL (ref 3.87–5.11)
RDW: 13 % (ref 11.5–15.5)
WBC: 6.1 10*3/uL (ref 4.0–10.5)

## 2017-08-06 ENCOUNTER — Other Ambulatory Visit: Payer: Self-pay

## 2017-08-06 ENCOUNTER — Inpatient Hospital Stay (HOSPITAL_COMMUNITY): Payer: Medicare Other

## 2017-08-06 ENCOUNTER — Inpatient Hospital Stay (HOSPITAL_COMMUNITY)
Admission: RE | Admit: 2017-08-06 | Discharge: 2017-08-08 | DRG: 470 | Disposition: A | Discharge status: Home / Self Care | Payer: Medicare Other | Source: Ambulatory Visit | Attending: Orthopedic Surgery | Admitting: Orthopedic Surgery

## 2017-08-06 ENCOUNTER — Inpatient Hospital Stay (HOSPITAL_COMMUNITY): Payer: Medicare Other | Admitting: Certified Registered Nurse Anesthetist

## 2017-08-06 ENCOUNTER — Encounter (HOSPITAL_COMMUNITY)
Admission: RE | Disposition: A | Discharge status: Home / Self Care | Payer: Self-pay | Source: Ambulatory Visit | Attending: Orthopedic Surgery

## 2017-08-06 ENCOUNTER — Encounter (HOSPITAL_COMMUNITY): Payer: Self-pay | Admitting: Emergency Medicine

## 2017-08-06 DIAGNOSIS — M1612 Unilateral primary osteoarthritis, left hip: Principal | ICD-10-CM | POA: Diagnosis present

## 2017-08-06 DIAGNOSIS — E669 Obesity, unspecified: Secondary | ICD-10-CM | POA: Diagnosis present

## 2017-08-06 DIAGNOSIS — Z6836 Body mass index (BMI) 36.0-36.9, adult: Secondary | ICD-10-CM | POA: Diagnosis not present

## 2017-08-06 DIAGNOSIS — Z471 Aftercare following joint replacement surgery: Secondary | ICD-10-CM | POA: Diagnosis not present

## 2017-08-06 DIAGNOSIS — Z853 Personal history of malignant neoplasm of breast: Secondary | ICD-10-CM

## 2017-08-06 DIAGNOSIS — Z9841 Cataract extraction status, right eye: Secondary | ICD-10-CM

## 2017-08-06 DIAGNOSIS — Z86718 Personal history of other venous thrombosis and embolism: Secondary | ICD-10-CM

## 2017-08-06 DIAGNOSIS — R110 Nausea: Secondary | ICD-10-CM | POA: Diagnosis not present

## 2017-08-06 DIAGNOSIS — Z96649 Presence of unspecified artificial hip joint: Secondary | ICD-10-CM

## 2017-08-06 DIAGNOSIS — Z880 Allergy status to penicillin: Secondary | ICD-10-CM

## 2017-08-06 DIAGNOSIS — Z96642 Presence of left artificial hip joint: Secondary | ICD-10-CM

## 2017-08-06 DIAGNOSIS — Z9842 Cataract extraction status, left eye: Secondary | ICD-10-CM | POA: Diagnosis not present

## 2017-08-06 HISTORY — PX: TOTAL HIP ARTHROPLASTY: SHX124

## 2017-08-06 LAB — TYPE AND SCREEN
ABO/RH(D): A POS
Antibody Screen: NEGATIVE

## 2017-08-06 SURGERY — ARTHROPLASTY, HIP, TOTAL, ANTERIOR APPROACH
Anesthesia: Spinal | Site: Hip | Laterality: Left

## 2017-08-06 MED ORDER — PHENYLEPHRINE 40 MCG/ML (10ML) SYRINGE FOR IV PUSH (FOR BLOOD PRESSURE SUPPORT)
PREFILLED_SYRINGE | INTRAVENOUS | Status: AC
  Filled 2017-08-06: qty 10

## 2017-08-06 MED ORDER — DIPHENHYDRAMINE HCL 12.5 MG/5ML PO ELIX: 12.5000 mg | ORAL_SOLUTION | ORAL | Status: DC | PRN

## 2017-08-06 MED ORDER — PROPOFOL 10 MG/ML IV BOLUS
INTRAVENOUS | Status: DC | PRN
  Administered 2017-08-06 (×2): 20 mg via INTRAVENOUS

## 2017-08-06 MED ORDER — ACETAMINOPHEN 325 MG PO TABS: 650.0000 mg | ORAL_TABLET | ORAL | Status: DC | PRN

## 2017-08-06 MED ORDER — ONDANSETRON HCL 4 MG PO TABS
4.0000 mg | ORAL_TABLET | Freq: Four times a day (QID) | ORAL | Status: DC | PRN
Start: 2017-08-06 — End: 2017-08-08
  Administered 2017-08-07: 02:00:00 4 mg via ORAL
  Filled 2017-08-06: qty 1

## 2017-08-06 MED ORDER — BUPIVACAINE HCL (PF) 0.75 % IJ SOLN
INTRAMUSCULAR | Status: DC | PRN
  Administered 2017-08-06: 2 mL via INTRATHECAL

## 2017-08-06 MED ORDER — CEFAZOLIN SODIUM-DEXTROSE 2-4 GM/100ML-% IV SOLN
2.0000 g | INTRAVENOUS | Status: AC
  Administered 2017-08-06: 2 g via INTRAVENOUS
  Filled 2017-08-06: qty 100

## 2017-08-06 MED ORDER — HYDROCODONE-ACETAMINOPHEN 7.5-325 MG PO TABS: 1.0000 | ORAL_TABLET | ORAL | 0 refills | Status: AC | PRN

## 2017-08-06 MED ORDER — METOCLOPRAMIDE HCL 5 MG PO TABS: 5.0000 mg | ORAL_TABLET | Freq: Three times a day (TID) | ORAL | Status: DC | PRN

## 2017-08-06 MED ORDER — ONDANSETRON HCL 4 MG/2ML IJ SOLN
INTRAMUSCULAR | Status: AC
  Filled 2017-08-06: qty 2

## 2017-08-06 MED ORDER — METHOCARBAMOL 500 MG PO TABS: 500.0000 mg | ORAL_TABLET | Freq: Four times a day (QID) | ORAL | 0 refills | Status: AC | PRN

## 2017-08-06 MED ORDER — HYDROCODONE-ACETAMINOPHEN 7.5-325 MG PO TABS
2.0000 | ORAL_TABLET | ORAL | Status: DC | PRN
  Administered 2017-08-07 – 2017-08-08 (×7): 2 via ORAL
  Filled 2017-08-06 (×8): qty 2

## 2017-08-06 MED ORDER — POLYETHYLENE GLYCOL 3350 17 G PO PACK: 17.0000 g | PACK | Freq: Every day | ORAL | 0 refills | Status: AC

## 2017-08-06 MED ORDER — MENTHOL 3 MG MT LOZG: 1.0000 | LOZENGE | OROMUCOSAL | Status: DC | PRN

## 2017-08-06 MED ORDER — PROPOFOL 10 MG/ML IV BOLUS
INTRAVENOUS | Status: AC
Start: 2017-08-06 — End: 2017-08-06
  Filled 2017-08-06: qty 60

## 2017-08-06 MED ORDER — CEFAZOLIN SODIUM-DEXTROSE 2-4 GM/100ML-% IV SOLN
2.0000 g | Freq: Four times a day (QID) | INTRAVENOUS | Status: AC
  Administered 2017-08-06 – 2017-08-07 (×2): 2 g via INTRAVENOUS
  Filled 2017-08-06 (×2): qty 100

## 2017-08-06 MED ORDER — ONDANSETRON HCL 4 MG/2ML IJ SOLN
INTRAMUSCULAR | Status: AC
Start: 2017-08-06 — End: 2017-08-06
  Filled 2017-08-06: qty 2

## 2017-08-06 MED ORDER — POLYETHYLENE GLYCOL 3350 17 G PO PACK: 17.0000 g | PACK | Freq: Two times a day (BID) | ORAL | 0 refills | Status: AC

## 2017-08-06 MED ORDER — DOCUSATE SODIUM 100 MG PO CAPS
100.0000 mg | ORAL_CAPSULE | Freq: Two times a day (BID) | ORAL | Status: DC
  Administered 2017-08-06 – 2017-08-08 (×4): 100 mg via ORAL
  Filled 2017-08-06 (×4): qty 1

## 2017-08-06 MED ORDER — PHENYLEPHRINE HCL 10 MG/ML IJ SOLN
INTRAVENOUS | Status: DC | PRN
  Administered 2017-08-06: 50 ug/min via INTRAVENOUS

## 2017-08-06 MED ORDER — HYDROMORPHONE HCL 1 MG/ML IJ SOLN
INTRAMUSCULAR | Status: AC
  Filled 2017-08-06: qty 2

## 2017-08-06 MED ORDER — FERROUS SULFATE 325 (65 FE) MG PO TABS
325.0000 mg | ORAL_TABLET | Freq: Three times a day (TID) | ORAL | Status: DC
  Administered 2017-08-07 – 2017-08-08 (×3): 325 mg via ORAL
  Filled 2017-08-06 (×3): qty 1

## 2017-08-06 MED ORDER — METOCLOPRAMIDE HCL 5 MG/ML IJ SOLN
5.0000 mg | Freq: Three times a day (TID) | INTRAMUSCULAR | Status: DC | PRN
  Administered 2017-08-06: 18:00:00 10 mg via INTRAVENOUS
  Filled 2017-08-06: qty 2

## 2017-08-06 MED ORDER — DOCUSATE SODIUM 100 MG PO CAPS: 100.0000 mg | ORAL_CAPSULE | Freq: Two times a day (BID) | ORAL | 0 refills | Status: AC

## 2017-08-06 MED ORDER — FERROUS SULFATE 325 (65 FE) MG PO TABS: 325.0000 mg | ORAL_TABLET | Freq: Three times a day (TID) | ORAL | 3 refills | Status: AC

## 2017-08-06 MED ORDER — PROPOFOL 10 MG/ML IV BOLUS
INTRAVENOUS | Status: AC
  Filled 2017-08-06: qty 20

## 2017-08-06 MED ORDER — DEXAMETHASONE SODIUM PHOSPHATE 10 MG/ML IJ SOLN
10.0000 mg | Freq: Once | INTRAMUSCULAR | Status: AC
  Administered 2017-08-07: 08:00:00 10 mg via INTRAVENOUS
  Filled 2017-08-06: qty 1

## 2017-08-06 MED ORDER — PROMETHAZINE HCL 25 MG/ML IJ SOLN: 6.2500 mg | INTRAMUSCULAR | Status: DC | PRN

## 2017-08-06 MED ORDER — CELECOXIB 200 MG PO CAPS
200.0000 mg | ORAL_CAPSULE | Freq: Two times a day (BID) | ORAL | Status: DC
Start: 2017-08-06 — End: 2017-08-08
  Administered 2017-08-06 – 2017-08-08 (×4): 200 mg via ORAL
  Filled 2017-08-06 (×4): qty 1

## 2017-08-06 MED ORDER — ONDANSETRON HCL 4 MG/2ML IJ SOLN
INTRAMUSCULAR | Status: DC | PRN
  Administered 2017-08-06: 4 mg via INTRAVENOUS

## 2017-08-06 MED ORDER — TRANEXAMIC ACID 1000 MG/10ML IV SOLN
2000.0000 mg | Freq: Once | INTRAVENOUS | Status: DC
  Filled 2017-08-06: qty 20

## 2017-08-06 MED ORDER — HYDROMORPHONE HCL 1 MG/ML IJ SOLN
0.5000 mg | INTRAMUSCULAR | Status: DC | PRN
  Administered 2017-08-06: 20:00:00 0.5 mg via INTRAVENOUS
  Administered 2017-08-06: 1 mg via INTRAVENOUS
  Filled 2017-08-06 (×2): qty 1

## 2017-08-06 MED ORDER — LIDOCAINE 2% (20 MG/ML) 5 ML SYRINGE
INTRAMUSCULAR | Status: DC | PRN
  Administered 2017-08-06: 60 mg via INTRAVENOUS

## 2017-08-06 MED ORDER — EPHEDRINE 5 MG/ML INJ
INTRAVENOUS | Status: AC
  Filled 2017-08-06: qty 10

## 2017-08-06 MED ORDER — PROMETHAZINE HCL 25 MG/ML IJ SOLN
6.2500 mg | Freq: Four times a day (QID) | INTRAMUSCULAR | Status: DC | PRN
Start: 2017-08-06 — End: 2017-08-08
  Administered 2017-08-06: 12.5 mg via INTRAVENOUS
  Filled 2017-08-06: qty 1

## 2017-08-06 MED ORDER — LACTATED RINGERS IV SOLN
INTRAVENOUS | Status: DC
  Administered 2017-08-06 (×2): via INTRAVENOUS

## 2017-08-06 MED ORDER — PROPOFOL 10 MG/ML IV BOLUS
INTRAVENOUS | Status: AC
Start: 2017-08-06 — End: 2017-08-06
  Filled 2017-08-06: qty 20

## 2017-08-06 MED ORDER — METHOCARBAMOL 500 MG PO TABS
500.0000 mg | ORAL_TABLET | Freq: Four times a day (QID) | ORAL | Status: DC | PRN
  Administered 2017-08-06 – 2017-08-08 (×2): 500 mg via ORAL
  Filled 2017-08-06 (×3): qty 1

## 2017-08-06 MED ORDER — TRANEXAMIC ACID 1000 MG/10ML IV SOLN
INTRAVENOUS | Status: AC | PRN
  Administered 2017-08-06: 2000 mg via TOPICAL

## 2017-08-06 MED ORDER — METHOCARBAMOL 1000 MG/10ML IJ SOLN
500.0000 mg | Freq: Four times a day (QID) | INTRAVENOUS | Status: DC | PRN
  Administered 2017-08-06: 500 mg via INTRAVENOUS
  Filled 2017-08-06: qty 550

## 2017-08-06 MED ORDER — PHENYLEPHRINE 40 MCG/ML (10ML) SYRINGE FOR IV PUSH (FOR BLOOD PRESSURE SUPPORT)
PREFILLED_SYRINGE | INTRAVENOUS | Status: DC | PRN
  Administered 2017-08-06 (×3): 80 ug via INTRAVENOUS

## 2017-08-06 MED ORDER — SODIUM CHLORIDE 0.9 % IR SOLN
Status: DC | PRN
  Administered 2017-08-06: 1000 mL

## 2017-08-06 MED ORDER — MAGNESIUM CITRATE PO SOLN: 1.0000 | Freq: Once | ORAL | Status: DC | PRN

## 2017-08-06 MED ORDER — RIVAROXABAN 10 MG PO TABS: 10.0000 mg | ORAL_TABLET | Freq: Every day | ORAL | 0 refills | Status: AC

## 2017-08-06 MED ORDER — CHLORHEXIDINE GLUCONATE 4 % EX LIQD: 60.0000 mL | Freq: Once | CUTANEOUS | Status: DC

## 2017-08-06 MED ORDER — DEXAMETHASONE SODIUM PHOSPHATE 10 MG/ML IJ SOLN
10.0000 mg | Freq: Once | INTRAMUSCULAR | Status: AC
  Administered 2017-08-06: 10 mg via INTRAVENOUS

## 2017-08-06 MED ORDER — PHENOL 1.4 % MT LIQD
1.0000 | OROMUCOSAL | Status: DC | PRN
  Filled 2017-08-06: qty 177

## 2017-08-06 MED ORDER — PROPOFOL 500 MG/50ML IV EMUL
INTRAVENOUS | Status: DC | PRN
  Administered 2017-08-06: 100 ug/kg/min via INTRAVENOUS

## 2017-08-06 MED ORDER — HYDROCODONE-ACETAMINOPHEN 7.5-325 MG PO TABS
1.0000 | ORAL_TABLET | ORAL | Status: DC | PRN
  Administered 2017-08-06: 1 via ORAL
  Filled 2017-08-06: qty 1

## 2017-08-06 MED ORDER — EPHEDRINE SULFATE-NACL 50-0.9 MG/10ML-% IV SOSY
PREFILLED_SYRINGE | INTRAVENOUS | Status: DC | PRN
  Administered 2017-08-06 (×2): 5 mg via INTRAVENOUS

## 2017-08-06 MED ORDER — ALBUMIN HUMAN 5 % IV SOLN
INTRAVENOUS | Status: AC
  Filled 2017-08-06: qty 250

## 2017-08-06 MED ORDER — ALUM & MAG HYDROXIDE-SIMETH 200-200-20 MG/5ML PO SUSP: 15.0000 mL | ORAL | Status: DC | PRN

## 2017-08-06 MED ORDER — OXYCODONE HCL 5 MG/5ML PO SOLN
5.0000 mg | Freq: Once | ORAL | Status: DC | PRN
  Filled 2017-08-06: qty 5

## 2017-08-06 MED ORDER — FENTANYL CITRATE (PF) 100 MCG/2ML IJ SOLN
INTRAMUSCULAR | Status: AC
  Filled 2017-08-06: qty 2

## 2017-08-06 MED ORDER — OXYCODONE HCL 5 MG PO TABS: 5.0000 mg | ORAL_TABLET | Freq: Once | ORAL | Status: DC | PRN

## 2017-08-06 MED ORDER — FENTANYL CITRATE (PF) 100 MCG/2ML IJ SOLN
INTRAMUSCULAR | Status: DC | PRN
  Administered 2017-08-06: 25 ug via INTRAVENOUS
  Administered 2017-08-06: 50 ug via INTRAVENOUS
  Administered 2017-08-06: 25 ug via INTRAVENOUS

## 2017-08-06 MED ORDER — MIDAZOLAM HCL 5 MG/5ML IJ SOLN
INTRAMUSCULAR | Status: DC | PRN
  Administered 2017-08-06: 2 mg via INTRAVENOUS

## 2017-08-06 MED ORDER — ONDANSETRON HCL 4 MG/2ML IJ SOLN
4.0000 mg | Freq: Once | INTRAMUSCULAR | Status: AC
  Administered 2017-08-06: 4 mg via INTRAVENOUS

## 2017-08-06 MED ORDER — ALBUMIN HUMAN 5 % IV SOLN
12.5000 g | Freq: Once | INTRAVENOUS | Status: AC
  Administered 2017-08-06: 12.5 g via INTRAVENOUS

## 2017-08-06 MED ORDER — BISACODYL 10 MG RE SUPP: 10.0000 mg | Freq: Every day | RECTAL | Status: DC | PRN

## 2017-08-06 MED ORDER — ACETAMINOPHEN 650 MG RE SUPP: 650.0000 mg | RECTAL | Status: DC | PRN

## 2017-08-06 MED ORDER — STERILE WATER FOR IRRIGATION IR SOLN
Status: DC | PRN
  Administered 2017-08-06: 2000 mL

## 2017-08-06 MED ORDER — ASPIRIN 81 MG PO CHEW: 81.0000 mg | CHEWABLE_TABLET | Freq: Two times a day (BID) | ORAL | 0 refills | Status: AC

## 2017-08-06 MED ORDER — ONDANSETRON HCL 4 MG/2ML IJ SOLN
4.0000 mg | Freq: Four times a day (QID) | INTRAMUSCULAR | Status: DC | PRN
  Administered 2017-08-06: 19:00:00 4 mg via INTRAVENOUS
  Filled 2017-08-06: qty 2

## 2017-08-06 MED ORDER — MIDAZOLAM HCL 2 MG/2ML IJ SOLN
INTRAMUSCULAR | Status: AC
  Filled 2017-08-06: qty 2

## 2017-08-06 MED ORDER — HYDROMORPHONE HCL 1 MG/ML IJ SOLN
0.2500 mg | INTRAMUSCULAR | Status: DC | PRN
  Administered 2017-08-06 (×4): 0.5 mg via INTRAVENOUS
  Administered 2017-08-06: 0.25 mg via INTRAVENOUS
  Administered 2017-08-06: 0.5 mg via INTRAVENOUS
  Administered 2017-08-06: 0.25 mg via INTRAVENOUS

## 2017-08-06 MED ORDER — POLYETHYLENE GLYCOL 3350 17 G PO PACK
17.0000 g | PACK | Freq: Two times a day (BID) | ORAL | Status: DC
  Administered 2017-08-07 – 2017-08-08 (×2): 17 g via ORAL
  Filled 2017-08-06 (×2): qty 1

## 2017-08-06 MED ORDER — SODIUM CHLORIDE 0.9 % IV SOLN
INTRAVENOUS | Status: DC
  Administered 2017-08-06 – 2017-08-07 (×2): via INTRAVENOUS

## 2017-08-06 MED ORDER — DEXAMETHASONE SODIUM PHOSPHATE 10 MG/ML IJ SOLN
INTRAMUSCULAR | Status: AC
  Filled 2017-08-06: qty 1

## 2017-08-06 MED ORDER — RIVAROXABAN 10 MG PO TABS
10.0000 mg | ORAL_TABLET | ORAL | Status: DC
  Administered 2017-08-07 – 2017-08-08 (×2): 10 mg via ORAL
  Filled 2017-08-06 (×2): qty 1

## 2017-08-06 SURGICAL SUPPLY — 37 items
BAG DECANTER FOR FLEXI CONT (MISCELLANEOUS) ×3 IMPLANT
BAG ZIPLOCK 12X15 (MISCELLANEOUS) ×3 IMPLANT
BLADE SAG 18X100X1.27 (BLADE) ×3 IMPLANT
CAPT HIP TOTAL 2 ×3 IMPLANT
CLOTH BEACON ORANGE TIMEOUT ST (SAFETY) ×3 IMPLANT
COVER PERINEAL POST (MISCELLANEOUS) ×3 IMPLANT
COVER SURGICAL LIGHT HANDLE (MISCELLANEOUS) ×3 IMPLANT
DERMABOND ADVANCED (GAUZE/BANDAGES/DRESSINGS) ×2
DERMABOND ADVANCED .7 DNX12 (GAUZE/BANDAGES/DRESSINGS) ×1 IMPLANT
DRAPE STERI IOBAN 125X83 (DRAPES) ×3 IMPLANT
DRAPE U-SHAPE 47X51 STRL (DRAPES) ×6 IMPLANT
DRESSING AQUACEL AG SP 3.5X10 (GAUZE/BANDAGES/DRESSINGS) ×1 IMPLANT
DRSG AQUACEL AG SP 3.5X10 (GAUZE/BANDAGES/DRESSINGS) ×3
DURAPREP 26ML APPLICATOR (WOUND CARE) ×3 IMPLANT
ELECT REM PT RETURN 15FT ADLT (MISCELLANEOUS) ×3 IMPLANT
GLOVE BIO SURGEON STRL SZ 6.5 (GLOVE) ×4 IMPLANT
GLOVE BIO SURGEONS STRL SZ 6.5 (GLOVE) ×2
GLOVE BIOGEL M STRL SZ7.5 (GLOVE) ×6 IMPLANT
GLOVE BIOGEL PI IND STRL 6.5 (GLOVE) ×2 IMPLANT
GLOVE BIOGEL PI IND STRL 7.5 (GLOVE) ×6 IMPLANT
GLOVE BIOGEL PI IND STRL 8.5 (GLOVE) ×1 IMPLANT
GLOVE BIOGEL PI INDICATOR 6.5 (GLOVE) ×4
GLOVE BIOGEL PI INDICATOR 7.5 (GLOVE) ×12
GLOVE BIOGEL PI INDICATOR 8.5 (GLOVE) ×2
GLOVE ECLIPSE 8.0 STRL XLNG CF (GLOVE) ×6 IMPLANT
GLOVE ORTHO TXT STRL SZ7.5 (GLOVE) ×3 IMPLANT
GOWN STRL REUS W/TWL LRG LVL3 (GOWN DISPOSABLE) ×9 IMPLANT
GOWN STRL REUS W/TWL XL LVL3 (GOWN DISPOSABLE) ×6 IMPLANT
HOLDER FOLEY CATH W/STRAP (MISCELLANEOUS) ×3 IMPLANT
PACK ANTERIOR HIP CUSTOM (KITS) ×3 IMPLANT
SUT MNCRL AB 4-0 PS2 18 (SUTURE) ×3 IMPLANT
SUT STRATAFIX 1PDS 45CM VIOLET (SUTURE) ×3 IMPLANT
SUT VIC AB 1 CT1 36 (SUTURE) ×9 IMPLANT
SUT VIC AB 2-0 CT1 27 (SUTURE) ×4
SUT VIC AB 2-0 CT1 TAPERPNT 27 (SUTURE) ×2 IMPLANT
TRAY FOLEY CATH SILVER 14FR (SET/KITS/TRAYS/PACK) ×3 IMPLANT
YANKAUER SUCT BULB TIP 10FT TU (MISCELLANEOUS) ×3 IMPLANT

## 2017-08-06 NOTE — Progress Notes (Signed)
Spoke with Reine Just, Pharmacist who spoke with Dr. Alvan Dame concerning pt allergy to amoxicillin and ancef order. Dr. Alvan Dame verified with Reine Just that it is okay to proceed with Ancef, to monitor pt closely.

## 2017-08-06 NOTE — Interval H&P Note (Signed)
History and Physical Interval Note:  08/06/2017 9:20 AM  Angelica White  has presented today for surgery, with the diagnosis of Left hip osteoarthritis  The various methods of treatment have been discussed with the patient and family. After consideration of risks, benefits and other options for treatment, the patient has consented to  Procedure(s) with comments: LEFT TOTAL HIP ARTHROPLASTY ANTERIOR APPROACH (Left) - 70 mins as a surgical intervention .  The patient's history has been reviewed, patient examined, no change in status, stable for surgery.  I have reviewed the patient's chart and labs.  Questions were answered to the patient's satisfaction.     Mauri Pole

## 2017-08-06 NOTE — Progress Notes (Signed)
PT Cancellation Note  Patient Details Name: Angelica White MRN: [id number] DOB: [birth year]   Cancelled Treatment:    Reason Eval/Treat Not Completed: Pt not ready for PT eval POD 0. Will check back on tomorrow.    Weston Anna, MPT Pager: [phone]

## 2017-08-06 NOTE — Discharge Instructions (Addendum)
INSTRUCTIONS AFTER JOINT REPLACEMENT  ° °o Remove items at home which could result in a fall. This includes throw rugs or furniture in walking pathways °o ICE to the affected joint every three hours while awake for 30 minutes at a time, for at least the first 3-5 days, and then as needed for pain and swelling.  Continue to use ice for pain and swelling. You may notice swelling that will progress down to the foot and ankle.  This is normal after surgery.  Elevate your leg when you are not up walking on it.   °o Continue to use the breathing machine you got in the hospital (incentive spirometer) which will help keep your temperature down.  It is common for your temperature to cycle up and down following surgery, especially at night when you are not up moving around and exerting yourself.  The breathing machine keeps your lungs expanded and your temperature down. ° ° °DIET:  As you were doing prior to hospitalization, we recommend a well-balanced diet. ° °DRESSING / WOUND CARE / SHOWERING ° °Keep the surgical dressing until follow up.  The dressing is water proof, so you can shower without any extra covering.  IF THE DRESSING FALLS OFF or the wound gets wet inside, change the dressing with sterile gauze.  Please use good hand washing techniques before changing the dressing.  Do not use any lotions or creams on the incision until instructed by your surgeon.   ° °ACTIVITY ° °o Increase activity slowly as tolerated, but follow the weight bearing instructions below.   °o No driving for 6 weeks or until further direction given by your physician.  You cannot drive while taking narcotics.  °o No lifting or carrying greater than 10 lbs. until further directed by your surgeon. °o Avoid periods of inactivity such as sitting longer than an hour when not asleep. This helps prevent blood clots.  °o You may return to work once you are authorized by your doctor.  ° ° ° °WEIGHT BEARING  ° °Weight bearing as tolerated with assist  device (Shepard, cane, etc) as directed, use it as long as suggested by your surgeon or therapist, typically at least 4-6 weeks. ° ° °EXERCISES ° °Results after joint replacement surgery are often greatly improved when you follow the exercise, range of motion and muscle strengthening exercises prescribed by your doctor. Safety measures are also important to protect the joint from further injury. Any time any of these exercises cause you to have increased pain or swelling, decrease what you are doing until you are comfortable again and then slowly increase them. If you have problems or questions, call your caregiver or physical therapist for advice.  ° °Rehabilitation is important following a joint replacement. After just a few days of immobilization, the muscles of the leg can become weakened and shrink (atrophy).  These exercises are designed to build up the tone and strength of the thigh and leg muscles and to improve motion. Often times heat used for twenty to thirty minutes before working out will loosen up your tissues and help with improving the range of motion but do not use heat for the first two weeks following surgery (sometimes heat can increase post-operative swelling).  ° °These exercises can be done on a training (exercise) mat, on the floor, on a table or on a bed. Use whatever works the best and is most comfortable for you.    Use music or television while you are exercising so that   the exercises are a pleasant break in your day. This will make your life better with the exercises acting as a break in your routine that you can look forward to.   Perform all exercises about fifteen times, three times per day or as directed.  You should exercise both the operative leg and the other leg as well. ° °Exercises include: °  °• Quad Sets - Tighten up the muscle on the front of the thigh (Quad) and hold for 5-10 seconds.   °• Straight Leg Raises - With your knee straight (if you were given a brace, keep it on),  lift the leg to 60 degrees, hold for 3 seconds, and slowly lower the leg.  Perform this exercise against resistance later as your leg gets stronger.  °• Leg Slides: Lying on your back, slowly slide your foot toward your buttocks, bending your knee up off the floor (only go as far as is comfortable). Then slowly slide your foot back down until your leg is flat on the floor again.  °• Angel Wings: Lying on your back spread your legs to the side as far apart as you can without causing discomfort.  °• Hamstring Strength:  Lying on your back, push your heel against the floor with your leg straight by tightening up the muscles of your buttocks.  Repeat, but this time bend your knee to a comfortable angle, and push your heel against the floor.  You may put a pillow under the heel to make it more comfortable if necessary.  ° °A rehabilitation program following joint replacement surgery can speed recovery and prevent re-injury in the future due to weakened muscles. Contact your doctor or a physical therapist for more information on knee rehabilitation.  ° ° °CONSTIPATION ° °Constipation is defined medically as fewer than three stools per week and severe constipation as less than one stool per week.  Even if you have a regular bowel pattern at home, your normal regimen is likely to be disrupted due to multiple reasons following surgery.  Combination of anesthesia, postoperative narcotics, change in appetite and fluid intake all can affect your bowels.  ° °YOU MUST use at least one of the following options; they are listed in order of increasing strength to get the job done.  They are all available over the counter, and you may need to use some, POSSIBLY even all of these options:   ° °Drink plenty of fluids (prune juice may be helpful) and high fiber foods °Colace 100 mg by mouth twice a day  °Senokot for constipation as directed and as needed Dulcolax (bisacodyl), take with full glass of water  °Miralax (polyethylene glycol)  once or twice a day as needed. ° °If you have tried all these things and are unable to have a bowel movement in the first 3-4 days after surgery call either your surgeon or your primary doctor.   ° °If you experience loose stools or diarrhea, hold the medications until you stool forms back up.  If your symptoms do not get better within 1 week or if they get worse, check with your doctor.  If you experience "the worst abdominal pain ever" or develop nausea or vomiting, please contact the office immediately for further recommendations for treatment. ° ° °ITCHING:  If you experience itching with your medications, try taking only a single pain pill, or even half a pain pill at a time.  You can also use Benadryl over the counter for itching or also to   help with sleep.  ° °TED HOSE STOCKINGS:  Use stockings on both legs until for at least 2 weeks or as directed by physician office. They may be removed at night for sleeping. ° °MEDICATIONS:  See your medication summary on the “After Visit Summary” that nursing will review with you.  You may have some home medications which will be placed on hold until you complete the course of blood thinner medication.  It is important for you to complete the blood thinner medication as prescribed. ° °PRECAUTIONS:  If you experience chest pain or shortness of breath - call 911 immediately for transfer to the hospital emergency department.  ° °If you develop a fever greater that 101 F, purulent drainage from wound, increased redness or drainage from wound, foul odor from the wound/dressing, or calf pain - CONTACT YOUR SURGEON.   °                                                °FOLLOW-UP APPOINTMENTS:  If you do not already have a post-op appointment, please call the office for an appointment to be seen by your surgeon.  Guidelines for how soon to be seen are listed in your “After Visit Summary”, but are typically between 1-4 weeks after surgery. ° °OTHER INSTRUCTIONS:  ° °Knee  Replacement:  Do not place pillow under knee, focus on keeping the knee straight while resting.  ° °MAKE SURE YOU:  °• Understand these instructions.  °• Get help right away if you are not doing well or get worse.  ° ° °Thank you for letting us be a part of your medical care team.  It is a privilege we respect greatly.  We hope these instructions will help you stay on track for a fast and full recovery!  °  ° °Information on my medicine - XARELTO® (Rivaroxaban) ° °Why was Xarelto® prescribed for you? °Xarelto® was prescribed for you to reduce the risk of blood clots forming after orthopedic surgery. The medical term for these abnormal blood clots is venous thromboembolism (VTE). ° °What do you need to know about xarelto® ? °Take your Xarelto® ONCE DAILY at the same time every day. °You may take it either with or without food. ° °If you have difficulty swallowing the tablet whole, you may crush it and mix in applesauce just prior to taking your dose. ° °Take Xarelto® exactly as prescribed by your doctor and DO NOT stop taking Xarelto® without talking to the doctor who prescribed the medication.  Stopping without other VTE prevention medication to take the place of Xarelto® may increase your risk of developing a clot. ° °After discharge, you should have regular check-up appointments with your healthcare provider that is prescribing your Xarelto®.   ° °What do you do if you miss a dose? °If you miss a dose, take it as soon as you remember on the same day then continue your regularly scheduled once daily regimen the next day. Do not take two doses of Xarelto® on the same day.  ° °Important Safety Information °A possible side effect of Xarelto® is bleeding. You should call your healthcare provider right away if you experience any of the following: °? Bleeding from an injury or your nose that does not stop. °? Unusual colored urine (red or dark brown) or unusual colored stools (red or black). °? Unusual   bruising for  unknown reasons. °? A serious fall or if you hit your head (even if there is no bleeding). ° °Some medicines may interact with Xarelto® and might increase your risk of bleeding while on Xarelto®. To help avoid this, consult your healthcare provider or pharmacist prior to using any new prescription or non-prescription medications, including herbals, vitamins, non-steroidal anti-inflammatory drugs (NSAIDs) and supplements. ° °This website has more information on Xarelto®: [email or web address].xarelto.com. ° ° °

## 2017-08-06 NOTE — Op Note (Signed)
NAME:  Angelica White NO.: [id number]      MEDICAL RECORD NO.: [id number]      FACILITY:  [facility]      PHYSICIAN:  Mauri Pole  DATE OF BIRTH:  1951/06/04     DATE OF PROCEDURE:  08/06/2017                                 OPERATIVE REPORT         PREOPERATIVE DIAGNOSIS: Left  hip osteoarthritis.      POSTOPERATIVE DIAGNOSIS:  Left hip osteoarthritis.      PROCEDURE:  Left total hip replacement through an anterior approach   utilizing DePuy THR system, component size 13mm pinnacle cup, a size 32+4 neutral   Altrex liner, a size 4 Hi Tri Lock stem with a 32+1 delta ceramic   ball.      SURGEON:  Pietro Cassis. Alvan Dame, M.D.      ASSISTANT:  Nehemiah Massed, PA-C     ANESTHESIA:  Spinal.      SPECIMENS:  None.      COMPLICATIONS:  None.      BLOOD LOSS:  200 cc     DRAINS:  None.      INDICATION OF THE PROCEDURE:  TOMISHA REPPUCCI is a 67 y.o. female who had   presented to office for evaluation of left hip pain.  Radiographs revealed   progressive degenerative changes with bone-on-bone   articulation to the  hip joint.  The patient had painful limited range of   motion significantly affecting their overall quality of life.  The patient was failing to    respond to conservative measures, and at this point was ready   to proceed with more definitive measures.  The patient has noted progressive   degenerative changes in his hip, progressive problems and dysfunction   with regarding the hip prior to surgery.  Consent was obtained for   benefit of pain relief.  Specific risk of infection, DVT, component   failure, dislocation, need for revision surgery, as well discussion of   the anterior versus posterior approach were reviewed.  Consent was   obtained for benefit of anterior pain relief through an anterior   approach.      PROCEDURE IN DETAIL:  The patient was brought to operative theater.   Once adequate anesthesia, preoperative  antibiotics, 2 gm of Ancef, 1 gm of Tranexamic Acid, and 10 mg of Decadron administered.   The patient was positioned supine on the OSI Hanna table.  Once adequate   padding of boney process was carried out, we had predraped out the hip, and  used fluoroscopy to confirm orientation of the pelvis and position.      The left hip was then prepped and draped from proximal iliac crest to   mid thigh with shower curtain technique.      Time-out was performed identifying the patient, planned procedure, and   extremity.     An incision was then made 2 cm distal and lateral to the   anterior superior iliac spine extending over the orientation of the   tensor fascia lata muscle and sharp dissection was carried down to the   fascia of the muscle and protractor placed in the soft tissues.      The  fascia was then incised.  The muscle belly was identified and swept   laterally and retractor placed along the superior neck.  Following   cauterization of the circumflex vessels and removing some pericapsular   fat, a second cobra retractor was placed on the inferior neck.  A third   retractor was placed on the anterior acetabulum after elevating the   anterior rectus.  A L-capsulotomy was along the line of the   superior neck to the trochanteric fossa, then extended proximally and   distally.  Tag sutures were placed and the retractors were then placed   intracapsular.  We then identified the trochanteric fossa and   orientation of my neck cut, confirmed this radiographically   and then made a neck osteotomy with the femur on traction.  The femoral   head was removed without difficulty or complication.  Traction was let   off and retractors were placed posterior and anterior around the   acetabulum.      The labrum and foveal tissue were debrided.  I began reaming with a 92mm   reamer and reamed up to 6mm reamer with good bony bed preparation and a 62mm   cup was chosen.  The final 73mm Pinnacle cup  was then impacted under fluoroscopy  to confirm the depth of penetration and orientation with respect to   abduction.  A screw was placed followed by the hole eliminator.  The final   32+4 neutral Altrex liner was impacted with good visualized rim fit.  The cup was positioned anatomically within the acetabular portion of the pelvis.      At this point, the femur was rolled at 80 degrees.  Further capsule was   released off the inferior aspect of the femoral neck.  I then   released the superior capsule proximally.  The hook was placed laterally   along the femur and elevated manually and held in position with the bed   hook.  The leg was then extended and adducted with the leg rolled to 100   degrees of external rotation.  Once the proximal femur was fully   exposed, I used a box osteotome to set orientation.  I then began   broaching with the starting chili pepper broach and passed this by hand and then broached up to 4.  With the 4 broach in place I chose a high offset neck and did several trial reductions.  The offset was appropriate, leg lengths   appeared to be equal best matched with the +1 head ball confirmed radiographically.   Given these findings I went ahead and dislocated the hip, repositioned all   retractors and positioned the right hip in the extended and abducted position.  The final 4 Hi Tri Lock stem was   chosen and it was impacted down to the level of neck cut.  Based on this   and the trial reduction, a 32+1 delta ceramic ball was chosen and   impacted onto a clean and dry trunnion, and the hip was reduced.  The   hip had been irrigated throughout the case again at this point.  I did   reapproximate the superior capsular leaflet to the anterior leaflet   using #1 Vicryl.  The fascia of the   tensor fascia lata muscle was then reapproximated using #1 Vicryl and #1 Stratafix sutures.  The   remaining wound was closed with 2-0 Vicryl and running 4-0 Monocryl.   The hip was  cleaned,  dried, and dressed sterilely using Dermabond and   Aquacel dressing.  She was then brought   to recovery room in stable condition tolerating the procedure well.    Nehemiah Massed, MD was present for the entirety of the case involved from   preoperative positioning, perioperative retractor management, general   facilitation of the case, as well as primary wound closure as assistant.            Pietro Cassis Alvan Dame, M.D.        08/06/2017 1:12 PM

## 2017-08-06 NOTE — Anesthesia Procedure Notes (Signed)
Spinal  Start time: 08/06/2017 11:39 AM End time: 08/06/2017 11:49 AM Staffing Anesthesiologist: Lynda Rainwater, MD Resident/CRNA: Wanita Chamberlain, CRNA Spinal Block Patient position: sitting Prep: DuraPrep Location: L2-3 Injection technique: single-shot Needle Needle type: Whitacre  Needle gauge: 22 G Needle length: 10 cm Needle insertion depth: 9 cm Assessment Events: paresthesia Additional Notes R Sauve CRNA attempts @ [facility]- neg paresthesia   Dr Royetta Car + paresthesia Pt L leg redirected to obtain clear CSF -Adequate block

## 2017-08-06 NOTE — Anesthesia Postprocedure Evaluation (Signed)
Anesthesia Post Note  Patient: Angelica White  Procedure(s) Performed: LEFT TOTAL HIP ARTHROPLASTY ANTERIOR APPROACH (Left Hip)     Patient location during evaluation: PACU Anesthesia Type: Spinal Level of consciousness: oriented and awake and alert Pain management: pain level controlled Vital Signs Assessment: post-procedure vital signs reviewed and stable Respiratory status: spontaneous breathing and respiratory function stable Cardiovascular status: blood pressure returned to baseline and stable Postop Assessment: no headache, no backache and no apparent nausea or vomiting Anesthetic complications: no    Last Vitals:  Vitals:   08/06/17 1535 08/06/17 1545  BP: (!) 88/60 (!) 88/54  Pulse: 67 72  Resp: (!) 7 17  Temp:    SpO2: 98% 98%    Last Pain:  Vitals:   08/06/17 1535  TempSrc:   PainSc: Asleep                 Lynda Rainwater

## 2017-08-06 NOTE — Anesthesia Preprocedure Evaluation (Signed)
Anesthesia Evaluation  Patient identified by MRN, date of birth, ID band Patient awake    Reviewed: Allergy & Precautions, NPO status , Patient's Chart, lab work & pertinent test results  Airway Mallampati: II  TM Distance: >3 FB Neck ROM: Full    Dental no notable dental hx.    Pulmonary neg pulmonary ROS,    Pulmonary exam normal breath sounds clear to auscultation       Cardiovascular negative cardio ROS Normal cardiovascular exam Rhythm:Regular Rate:Normal     Neuro/Psych negative neurological ROS  negative psych ROS   GI/Hepatic negative GI ROS, Neg liver ROS,   Endo/Other  negative endocrine ROS  Renal/GU negative Renal ROS  negative genitourinary   Musculoskeletal negative musculoskeletal ROS (+) Arthritis , Osteoarthritis,    Abdominal (+) + obese,   Peds negative pediatric ROS (+)  Hematology negative hematology ROS (+)   Anesthesia Other Findings   Reproductive/Obstetrics negative OB ROS                             Anesthesia Physical Anesthesia Plan  ASA: II  Anesthesia Plan: Spinal   Post-op Pain Management:    Induction: Intravenous  PONV Risk Score and Plan: 2 and Ondansetron and Midazolam  Airway Management Planned: Simple Face Mask  Additional Equipment:   Intra-op Plan:   Post-operative Plan:   Informed Consent: I have reviewed the patients History and Physical, chart, labs and discussed the procedure including the risks, benefits and alternatives for the proposed anesthesia with the patient or authorized representative who has indicated his/her understanding and acceptance.   Dental advisory given  Plan Discussed with: CRNA  Anesthesia Plan Comments:         Anesthesia Quick Evaluation

## 2017-08-06 NOTE — Transfer of Care (Signed)
Immediate Anesthesia Transfer of Care Note  Patient: Angelica White  Procedure(s) Performed: LEFT TOTAL HIP ARTHROPLASTY ANTERIOR APPROACH (Left Hip)  Patient Location: PACU  Anesthesia Type:Spinal  Level of Consciousness: drowsy and patient cooperative  Airway & Oxygen Therapy: Patient Spontanous Breathing and Patient connected to face mask  Post-op Assessment: Report given to RN and Post -op Vital signs reviewed and stable  Post vital signs: Reviewed and stable  Last Vitals:  Vitals:   08/06/17 0914  BP: 125/78  Pulse: 67  Resp: 20  Temp: 36.5 C  SpO2: 98%    Last Pain:  Vitals:   08/06/17 0914  TempSrc: Oral      Patients Stated Pain Goal: 4 (63/33/54 5625)  Complications: No apparent anesthesia complications

## 2017-08-07 DIAGNOSIS — E669 Obesity, unspecified: Secondary | ICD-10-CM | POA: Diagnosis present

## 2017-08-07 LAB — BASIC METABOLIC PANEL
Anion gap: 7 (ref 5–15)
BUN: 16 mg/dL (ref 6–20)
CALCIUM: 9.5 mg/dL (ref 8.9–10.3)
CO2: 23 mmol/L (ref 22–32)
CREATININE: 0.72 mg/dL (ref 0.44–1.00)
Chloride: 107 mmol/L (ref 101–111)
GFR calc Af Amer: >60 mL/min (ref >60)
GFR calc non Af Amer: >60 mL/min (ref >60)
Glucose, Bld: 123 mg/dL — ABNORMAL HIGH (ref 65–99)
Potassium: 4.6 mmol/L (ref 3.5–5.1)
Sodium: 137 mmol/L (ref 135–145)

## 2017-08-07 LAB — CBC
HEMATOCRIT: 36.7 % (ref 36.0–46.0)
Hemoglobin: 12.4 g/dL (ref 12.0–15.0)
MCH: 32.7 pg (ref 26.0–34.0)
MCHC: 33.8 g/dL (ref 30.0–36.0)
MCV: 96.8 fL (ref 78.0–100.0)
Platelets: 205 10*3/uL (ref 150–400)
RBC: 3.79 MIL/uL — ABNORMAL LOW (ref 3.87–5.11)
RDW: 13.1 % (ref 11.5–15.5)
WBC: 12.4 10*3/uL — ABNORMAL HIGH (ref 4.0–10.5)

## 2017-08-07 NOTE — Evaluation (Signed)
Physical Therapy Evaluation Patient Details Name: Angelica White MRN: [id number] DOB: [birth year] Today's Date: 08/07/2017   History of Present Illness  67 yo female s/p L THA-direct anterior 08/06/17.   Clinical Impression  On eval, pt required Min assist for mobility. She walked ~35 feet with a RW. Mild-Moderate pain with activity. Will follow and progress activity as tolerated.     Follow Up Recommendations DC plan and follow up therapy as arranged by surgeon    Equipment Recommendations  Rolling Lorentz with 5" wheels    Recommendations for Other Services       Precautions / Restrictions Precautions Precautions: Fall Restrictions Weight Bearing Restrictions: No LLE Weight Bearing: Weight bearing as tolerated      Mobility  Bed Mobility Overal bed mobility: Needs Assistance Bed Mobility: Supine to Sit     Supine to sit: Min assist;HOB elevated     General bed mobility comments: Assist for L LE. Increased time. Cues for safety, technique.   Transfers Overall transfer level: Needs assistance Equipment used: Rolling Coppinger (2 wheeled) Transfers: Sit to/from Stand Sit to Stand: Min assist         General transfer comment: Assist to rise, stabilize, control descent. VCs safety, technique, hand placement.   Ambulation/Gait Ambulation/Gait assistance: Min assist Ambulation Distance (Feet): 35 Feet Assistive device: Rolling Basic (2 wheeled) Gait Pattern/deviations: Step-to pattern     General Gait Details: Assist to stabilize throughout ambulation. VCs safety, sequence, technique. Slow gait speed.   Stairs            Wheelchair Mobility    Modified Rankin (Stroke Patients Only)       Balance Overall balance assessment: Needs assistance         Standing balance support: Bilateral upper extremity supported Standing balance-Leahy Scale: Poor                               Pertinent Vitals/Pain Pain Assessment: Faces Faces Pain  Scale: Hurts little more Pain Location: L hip/thigh Pain Descriptors / Indicators: Sore;Tightness Pain Intervention(s): Limited activity within patient's tolerance;Repositioned    Home Living Family/patient expects to be discharged to:: Private residence Living Arrangements: Spouse/significant other Available Help at Discharge: Family;Available PRN/intermittently Type of Home: House Home Access: Stairs to enter Entrance Stairs-Rails: (one side) Entrance Stairs-Number of Steps: 5 Home Layout: (main level has bedroom) Home Equipment: Shower seat - built in;[place]berger - 4 wheels      Prior Function Level of Independence: Independent               Hand Dominance        Extremity/Trunk Assessment   Upper Extremity Assessment Upper Extremity Assessment: Defer to OT evaluation    Lower Extremity Assessment Lower Extremity Assessment: Generalized weakness(s/p L THA)    Cervical / Trunk Assessment Cervical / Trunk Assessment: Normal  Communication   Communication: No difficulties  Cognition Arousal/Alertness: Awake/alert Behavior During Therapy: WFL for tasks assessed/performed Overall Cognitive Status: Within Functional Limits for tasks assessed                                        General Comments General comments (skin integrity, edema, etc.): BP stable; no dizziness    Exercises     Assessment/Plan    PT Assessment Patient needs continued PT services  PT Problem List Decreased strength;Decreased  balance;Decreased range of motion;Decreased mobility;Pain;Decreased activity tolerance;Decreased knowledge of use of DME       PT Treatment Interventions Functional mobility training;DME instruction;Balance training;Patient/family education;Therapeutic activities;Stair training;Gait training;Therapeutic exercise    PT Goals (Current goals can be found in the Care Plan section)  Acute Rehab PT Goals Patient Stated Goal: return to independence PT Goal  Formulation: With patient Time For Goal Achievement: 08/21/17 Potential to Achieve Goals: Good    Frequency 7X/week   Barriers to discharge        Co-evaluation   Reason for Co-Treatment: For patient/therapist safety PT goals addressed during session: Mobility/safety with mobility OT goals addressed during session: ADL's and self-care       AM-PAC PT "6 Clicks" Daily Activity  Outcome Measure Difficulty turning over in bed (including adjusting bedclothes, sheets and blankets)?: Unable Difficulty moving from lying on back to sitting on the side of the bed? : Unable Difficulty sitting down on and standing up from a chair with arms (e.g., wheelchair, bedside commode, etc,.)?: Unable Help needed moving to and from a bed to chair (including a wheelchair)?: A Little Help needed walking in hospital room?: A Little Help needed climbing 3-5 steps with a railing? : A Lot 6 Click Score: 11    End of Session Equipment Utilized During Treatment: Gait belt Activity Tolerance: Patient tolerated treatment well Patient left: in chair;with call bell/phone within reach;with chair alarm set   PT Visit Diagnosis: Muscle weakness (generalized) (M62.81);Difficulty in walking, not elsewhere classified (R26.2);Pain Pain - Right/Left: Left Pain - part of body: Hip    Time: 0935-1001 PT Time Calculation (min) (ACUTE ONLY): 26 min   Charges:   PT Evaluation $PT Eval Moderate Complexity: 1 Mod     PT G Codes:          Weston Anna, MPT Pager: [phone]

## 2017-08-07 NOTE — Evaluation (Signed)
Occupational Therapy Evaluation Patient Details Name: Angelica White MRN: [id number] DOB: 26-Sep-1950 Today's Date: 08/07/2017    History of Present Illness s/p L DA THA   Clinical Impression   This 67 year old female was admitted for the above sx.  Will follow in acute setting with supervision to min A level     Follow Up Recommendations  Supervision/Assistance - 24 hour    Equipment Recommendations  None recommended by OT    Recommendations for Other Services       Precautions / Restrictions Precautions Precautions: Fall Restrictions Weight Bearing Restrictions: No      Mobility Bed Mobility               General bed mobility comments: min A for LLE  Transfers Overall transfer level: Needs assistance Equipment used: Rolling Lick (2 wheeled) Transfers: Sit to/from Stand Sit to Stand: Min assist         General transfer comment: assist to rise and stabilize    Balance                                           ADL either performed or assessed with clinical judgement   ADL Overall ADL's : Needs assistance/impaired Eating/Feeding: Independent   Grooming: Set up;Sitting   Upper Body Bathing: Set up;Sitting   Lower Body Bathing: Moderate assistance;Bed level   Upper Body Dressing : Set up;Sitting   Lower Body Dressing: Moderate assistance;Sit to/from stand   Toilet Transfer: Minimal assistance;BSC;RW   Toileting- Clothing Manipulation and Hygiene: Minimal assistance;Sit to/from stand         General ADL Comments: ambulated to bathroom with min A.  Husband can assist as needed at home     Vision         Perception     Praxis      Pertinent Vitals/Pain Pain Assessment: No/denies pain     Hand Dominance     Extremity/Trunk Assessment Upper Extremity Assessment Upper Extremity Assessment: Overall WFL for tasks assessed           Communication Communication Communication: No difficulties   Cognition  Arousal/Alertness: Awake/alert Behavior During Therapy: WFL for tasks assessed/performed Overall Cognitive Status: Within Functional Limits for tasks assessed                                     General Comments  BP stable; no dizziness    Exercises     Shoulder Instructions      Home Living Family/patient expects to be discharged to:: Private residence Living Arrangements: Spouse/significant other Available Help at Discharge: Family;Available PRN/intermittently Type of Home: House Home Access: Stairs to enter [employer] of Steps: 5 Entrance Stairs-Rails: (one side) Home Layout: (main level has bedroom)     Bathroom Shower/Tub: Teacher, early years/pre: Handicapped height     Home Equipment: Shower seat - built in;[place] - 4 wheels          Prior Functioning/Environment Level of Independence: Independent                 OT Problem List: Decreased strength;Decreased activity tolerance;Decreased knowledge of use of DME or AE;Pain      OT Treatment/Interventions: Self-care/ADL training;DME and/or AE instruction;Patient/family education    OT Goals(Current goals can be found  in the care plan section) Acute Rehab OT Goals Patient Stated Goal: return to independence OT Goal Formulation: With patient Time For Goal Achievement: 08/21/17 Potential to Achieve Goals: Good ADL Goals Pt Will Perform Grooming: with supervision;standing Pt Will Perform Lower Body Bathing: with min assist;sit to/from stand Pt Will Transfer to Toilet: with min guard assist;ambulating;bedside commode Pt Will Perform Toileting - Clothing Manipulation and hygiene: with supervision;sit to/from stand  OT Frequency: Min 2X/week   Barriers to D/C:            Co-evaluation PT/OT/SLP Co-Evaluation/Treatment: Yes Reason for Co-Treatment: For patient/therapist safety PT goals addressed during session: Mobility/safety with mobility OT goals addressed during  session: ADL's and self-care      AM-PAC PT "6 Clicks" Daily Activity     Outcome Measure Help from another person eating meals?: None Help from another person taking care of personal grooming?: A Little Help from another person toileting, which includes using toliet, bedpan, or urinal?: A Little Help from another person bathing (including washing, rinsing, drying)?: A Lot Help from another person to put on and taking off regular upper body clothing?: A Little Help from another person to put on and taking off regular lower body clothing?: A Lot 6 Click Score: 17   End of Session    Activity Tolerance: Patient tolerated treatment well Patient left: in chair;with call bell/phone within reach;with chair alarm set  OT Visit Diagnosis: Pain Pain - Right/Left: Left Pain - part of body: Hip                Time: 8916-9450 OT Time Calculation (min): 27 min Charges:  OT General Charges $OT Visit: 1 Visit OT Evaluation $OT Eval Low Complexity: 1 Low G-Codes:     [place], OTR/L [phone] 08/07/2017  Adonias Demore 08/07/2017, 10:58 AM

## 2017-08-07 NOTE — Plan of Care (Signed)
Plan of care reviewed and discussed with patient.   

## 2017-08-07 NOTE — Progress Notes (Signed)
     Subjective: 1 Day Post-Op Procedure(s) (LRB): LEFT TOTAL HIP ARTHROPLASTY ANTERIOR APPROACH (Left)   Patient reports pain as mild, pain controlled. Nausea last night, but doing better this morning.  Looking forward to working with PT.  Ready to be discharged home if she does well with PT.  Objective:   VITALS:   Vitals:   08/07/17 0202 08/07/17 0638  BP: (!) 104/58 (!) 94/53  Pulse: 76 60  Resp: 16 17  Temp: 97.6 F (36.4 C) 97.6 F (36.4 C)  SpO2: 95% 94%    Dorsiflexion/Plantar flexion intact Incision: dressing C/D/I No cellulitis present Compartment soft  LABS Recent Labs    08/07/17 0552  HGB 12.4  HCT 36.7  WBC 12.4*  PLT 205    Recent Labs    08/07/17 0552  NA 137  K 4.6  BUN 16  CREATININE 0.72  GLUCOSE 123*     Assessment/Plan: 1 Day Post-Op Procedure(s) (LRB): LEFT TOTAL HIP ARTHROPLASTY ANTERIOR APPROACH (Left) Foley cath d/c'ed Advance diet Up with therapy D/C IV fluids Discharge home, if she does well with pain control and N&V under control.  Follow up in 2 weeks at [facility] Follow up with OLIN,Jeilani Grupe D in 2 weeks.  Contact information:  [facility] [address] [place] [phone]    Obese (BMI 30-39.9)  Estimated body mass index is 36.4 kg/m as calculated from the following:   Height as of this encounter: 5\' 2"  (1.575 m).   Weight as of this encounter: 90.3 kg (199 lb). Patient also counseled that weight may inhibit the healing process Patient counseled that losing weight will help with future health issues     Angelica White. Angelica White   PAC  08/07/2017, 9:10 AM

## 2017-08-07 NOTE — Progress Notes (Addendum)
Physical Therapy Treatment Patient Details Name: Angelica White MRN: [id number] DOB: October 07, 1950 Today's Date: 08/07/2017    History of Present Illness 67 yo female s/p L THA-direct anterior 08/06/17.     PT Comments    Progressing with mobility. Will plan to practice stair negotiation on tomorrow. Plan is for possible d/c home tomorrow.    Follow Up Recommendations  DC plan and follow up therapy as arranged by surgeon     Equipment Recommendations  Rolling Potenza with 5" wheels    Recommendations for Other Services       Precautions / Restrictions Precautions Precautions: Fall Restrictions Weight Bearing Restrictions: No LLE Weight Bearing: Weight bearing as tolerated    Mobility  Bed Mobility Overal bed mobility: Needs Assistance Bed Mobility: Sit to Supine       Sit to supine: Min assist;HOB elevated   General bed mobility comments: Assist for L LE. Increased time. Cues for safety, technique.   Transfers Overall transfer level: Needs assistance Equipment used: Rolling Wuertz (2 wheeled) Transfers: Sit to/from Stand Sit to Stand: Min assist         General transfer comment: Assist to rise, stabilize, control descent. VCs safety, technique, hand placement.   Ambulation/Gait Ambulation/Gait assistance: Min guard Ambulation Distance (Feet): 65 Feet Assistive device: Rolling Scarfo (2 wheeled) Gait Pattern/deviations: Step-to pattern     General Gait Details: Assist to stabilize intermittently. VCs safety, sequence, technique. Slow gait speed.    Stairs            Wheelchair Mobility    Modified Rankin (Stroke Patients Only)       Balance                                            Cognition Arousal/Alertness: Awake/alert Behavior During Therapy: WFL for tasks assessed/performed Overall Cognitive Status: Within Functional Limits for tasks assessed                                        Exercises  Ankle  pumps, 10 reps, active, bilateral Quad sets, 10 reps, active, bilateral Heel Slides, 10 reps, active, left Hip Abduction/Adduction, 10 reps, active, left    General Comments        Pertinent Vitals/Pain Pain Assessment: Faces Faces Pain Scale: Hurts little more Pain Location: L hip/thigh Pain Descriptors / Indicators: Sore;Tightness Pain Intervention(s): Monitored during session;Repositioned    Home Living                      Prior Function            PT Goals (current goals can now be found in the care plan section) Progress towards PT goals: Progressing toward goals    Frequency    7X/week      PT Plan Current plan remains appropriate    Co-evaluation              AM-PAC PT "6 Clicks" Daily Activity  Outcome Measure  Difficulty turning over in bed (including adjusting bedclothes, sheets and blankets)?: A Lot Difficulty moving from lying on back to sitting on the side of the bed? : Unable Difficulty sitting down on and standing up from a chair with arms (e.g., wheelchair, bedside commode, etc,.)?: Unable Help needed moving  to and from a bed to chair (including a wheelchair)?: A Little Help needed walking in hospital room?: A Little Help needed climbing 3-5 steps with a railing? : A Little 6 Click Score: 13    End of Session Equipment Utilized During Treatment: Gait belt Activity Tolerance: Patient tolerated treatment well Patient left: in bed;with call bell/phone within reach;with bed alarm set   PT Visit Diagnosis: Muscle weakness (generalized) (M62.81);Difficulty in walking, not elsewhere classified (R26.2);Pain Pain - Right/Left: Left Pain - part of body: Hip     Time: 9449-6759 PT Time Calculation (min) (ACUTE ONLY): 21 min  Charges:  $Gait Training: 8-22 mins                    G Codes:        Weston Anna, MPT Pager: [phone]

## 2017-08-08 LAB — CBC
HCT: 34.8 % — ABNORMAL LOW (ref 36.0–46.0)
Hemoglobin: 11.5 g/dL — ABNORMAL LOW (ref 12.0–15.0)
MCH: 32.6 pg (ref 26.0–34.0)
MCHC: 33 g/dL (ref 30.0–36.0)
MCV: 98.6 fL (ref 78.0–100.0)
Platelets: 237 10*3/uL (ref 150–400)
RBC: 3.53 MIL/uL — ABNORMAL LOW (ref 3.87–5.11)
RDW: 13.4 % (ref 11.5–15.5)
WBC: 12.9 10*3/uL — ABNORMAL HIGH (ref 4.0–10.5)

## 2017-08-08 LAB — BASIC METABOLIC PANEL
Anion gap: 6 (ref 5–15)
BUN: 25 mg/dL — AB (ref 6–20)
CALCIUM: 9.6 mg/dL (ref 8.9–10.3)
CO2: 24 mmol/L (ref 22–32)
Chloride: 108 mmol/L (ref 101–111)
Creatinine, Ser: 0.7 mg/dL (ref 0.44–1.00)
GFR calc Af Amer: >60 mL/min (ref >60)
GFR calc non Af Amer: >60 mL/min (ref >60)
GLUCOSE: 120 mg/dL — AB (ref 65–99)
Potassium: 4.4 mmol/L (ref 3.5–5.1)
Sodium: 138 mmol/L (ref 135–145)

## 2017-08-08 NOTE — Progress Notes (Signed)
Patient ID: Angelica White, female   DOB: 1950-10-23, 67 y.o.   MRN: [id number] Subjective: 2 Days Post-Op Procedure(s) (LRB): LEFT TOTAL HIP ARTHROPLASTY ANTERIOR APPROACH (Left)    Patient reports pain as mild.  No significant PONV, feeling a lot better.  Left hip feels good as well, happy  Objective:   VITALS:   Vitals:   08/07/17 2225 08/08/17 0607  BP: 109/60 (!) 102/55  Pulse: 63 61  Resp: 15 15  Temp: 98.1 F (36.7 C) 98 F (36.7 C)  SpO2: 94% 98%    Neurovascular intact Incision: dressing C/D/I  LABS Recent Labs    08/07/17 0552 08/08/17 0528  HGB 12.4 11.5*  HCT 36.7 34.8*  WBC 12.4* 12.9*  PLT 205 237    Recent Labs    08/07/17 0552 08/08/17 0528  NA 137 138  K 4.6 4.4  BUN 16 25*  CREATININE 0.72 0.70  GLUCOSE 123* 120*    No results for input(s): LABPT, INR in the last 72 hours.   Assessment/Plan: 2 Days Post-Op Procedure(s) (LRB): LEFT TOTAL HIP ARTHROPLASTY ANTERIOR APPROACH (Left)   Up with therapy  Home today after therapy RTC in 2 weeks WBAT

## 2017-08-08 NOTE — Progress Notes (Signed)
Occupational Therapy Treatment Patient Details Name: Angelica White MRN: [id number] DOB: 1950-08-27 Today's Date: 08/08/2017    History of present illness 67 yo female s/p L THA-direct anterior 08/06/17.    OT comments  All education was completed this session  Follow Up Recommendations  Supervision/Assistance - 24 hour    Equipment Recommendations  None recommended by OT    Recommendations for Other Services      Precautions / Restrictions Precautions Precautions: Fall Restrictions LLE Weight Bearing: Weight bearing as tolerated       Mobility Bed Mobility           Sit to supine: Min assist   General bed mobility comments: Assist for L LE. Increased time. Cues for safety, technique.   Transfers   Equipment used: Rolling Barreiro (2 wheeled)   Sit to Stand: Min guard         General transfer comment: for safety    Balance                                           ADL either performed or assessed with clinical judgement   ADL       Grooming: Oral care;Standing;Supervision/safety               Lower Body Dressing: Minimal assistance;Sit to/from stand;With adaptive equipment   Toilet Transfer: Min guard;Ambulation;RW(chair)             General ADL Comments: pt got dressed; she had already bathed. She is interested in a Secondary school teacher for ADLs.  She will need assistance for teds/L shoe.  Re-educated on tub readiness and sequence to step in     Vision       Perception     Praxis      Cognition Arousal/Alertness: Awake/alert Behavior During Therapy: [facility] for tasks assessed/performed                                            Exercises     Shoulder Instructions       General Comments      Pertinent Vitals/ Pain       Pain Assessment: Faces Faces Pain Scale: Hurts a little bit Pain Location: L hip/thigh Pain Descriptors / Indicators: Sore Pain Intervention(s): Limited activity within patient's  tolerance;Monitored during session;Premedicated before session;Repositioned;Ice applied  Home Living                                          Prior Functioning/Environment              Frequency           Progress Toward Goals  OT Goals(current goals can now be found in the care plan section)  Progress towards OT goals: Progressing toward goals(no further OT is needed)     Plan      Co-evaluation                 AM-PAC PT "6 Clicks" Daily Activity     Outcome Measure   Help from another person eating meals?: None Help from another person taking care of personal grooming?: A Little Help from another person toileting,  which includes using toliet, bedpan, or urinal?: A Little Help from another person bathing (including washing, rinsing, drying)?: A Little Help from another person to put on and taking off regular upper body clothing?: A Little Help from another person to put on and taking off regular lower body clothing?: A Lot 6 Click Score: 18    End of Session        Activity Tolerance Patient tolerated treatment well   Patient Left in chair;with call bell/phone within reach;with chair alarm set   Nurse Communication          Time: [phone] OT Time Calculation (min): 31 min  Charges: OT General Charges $OT Visit: 1 Visit OT Treatments $Self Care/Home Management : 23-37 mins  Lesle Chris, OTR/L [phone] 08/08/2017   Angelica White 08/08/2017, 10:10 AM

## 2017-08-08 NOTE — Progress Notes (Addendum)
Physical Therapy Treatment Patient Details Name: Angelica White MRN: [id number] DOB: 03-Oct-1950 Today's Date: 08/08/2017    History of Present Illness 67 yo female s/p L THA-direct anterior 08/06/17.     PT Comments    Pt continues to progress well. Reviewed/practiced exercises, gait training, and stair negotiation training. Issued HEP for pt to perform 2-3x/day. All education completed. Okay to d/c from PT standpoint-made RN aware.     Follow Up Recommendations  DC plan and follow up therapy as arranged by surgeon     Equipment Recommendations  (pt stated she will use her rollator)    Recommendations for Other Services       Precautions / Restrictions Precautions Precautions: Fall Restrictions Weight Bearing Restrictions: No LLE Weight Bearing: Weight bearing as tolerated    Mobility  Bed Mobility Overal bed mobility: Needs Assistance Bed Mobility: Sit to Supine       Sit to supine: Min assist;HOB elevated   General bed mobility comments: Assist for L LE. Increased time. Cues for safety, technique.   Transfers Overall transfer level: Needs assistance Equipment used: Rolling Sivley (2 wheeled) Transfers: Sit to/from Stand Sit to Stand: Supervision         General transfer comment: for safety  Ambulation/Gait Ambulation/Gait assistance: Min guard Ambulation Distance (Feet): 100 Feet Assistive device: Rolling Kita (2 wheeled) Gait Pattern/deviations: Step-through pattern;Decreased stride length     General Gait Details: close guard for safety.    Stairs Stairs: Yes Min Assist Stair Management: One rail Left;Step to pattern;Forwards Number of Stairs: 4 General stair comments: 1 rail, 1 HHA. Practiced 2 steps x 2 (portable steps). VCs safety, technique, sequence.   Wheelchair Mobility    Modified Rankin (Stroke Patients Only)       Balance                                            Cognition Arousal/Alertness:  Awake/alert Behavior During Therapy: WFL for tasks assessed/performed Overall Cognitive Status: Within Functional Limits for tasks assessed                                        Exercises Total Joint Exercises Hip ABduction/ADduction: AROM;Left;10 reps;Standing Long Arc Quad: AROM;Left;10 reps;Seated Knee Flexion: AROM;Left;10 reps;Standing Marching in Standing: AROM;Left;10 reps;Standing Standing Hip Extension: AROM;Left;10 reps;Standing General Exercises - Lower Extremity Heel Raises: AROM;Left;10 reps;Standing    General Comments        Pertinent Vitals/Pain Pain Assessment: Faces Faces Pain Scale: Hurts a little bit Pain Location: L hip/thigh Pain Descriptors / Indicators: Sore Pain Intervention(s): Monitored during session;Repositioned;Ice applied    Home Living                      Prior Function            PT Goals (current goals can now be found in the care plan section) Progress towards PT goals: Progressing toward goals    Frequency    7X/week      PT Plan Current plan remains appropriate    Co-evaluation              AM-PAC PT "6 Clicks" Daily Activity  Outcome Measure  Difficulty turning over in bed (including adjusting bedclothes, sheets and blankets)?: A Little Difficulty  moving from lying on back to sitting on the side of the bed? : Unable Difficulty sitting down on and standing up from a chair with arms (e.g., wheelchair, bedside commode, etc,.)?: A Little Help needed moving to and from a bed to chair (including a wheelchair)?: A Little Help needed walking in hospital room?: A Little Help needed climbing 3-5 steps with a railing? : A Little 6 Click Score: 16    End of Session Equipment Utilized During Treatment: Gait belt Activity Tolerance: Patient tolerated treatment well Patient left: in bed;with call bell/phone within reach   PT Visit Diagnosis: Muscle weakness (generalized) (M62.81);Difficulty in  walking, not elsewhere classified (R26.2)     Time: 6484-7207 PT Time Calculation (min) (ACUTE ONLY): 23 min  Charges:  $Gait Training: 8-22 mins                    G Codes:          Weston Anna, MPT Pager: [phone]

## 2017-08-09 NOTE — Discharge Summary (Signed)
Physician Discharge Summary  Patient ID: Angelica White MRN: [id number] DOB/AGE: December 29, 1950 67 y.o.  Admit date: 08/06/2017 Discharge date: 08/08/2017   Procedures:  Procedure(s) (LRB): LEFT TOTAL HIP ARTHROPLASTY ANTERIOR APPROACH (Left)  Attending Physician:  Dr. Paralee Cancel   Admission Diagnoses:   Left hip primary OA / pain  Discharge Diagnoses:  Principal Problem:   S/P left THA, AA Active Problems:   Obese  Past Medical History:  Diagnosis Date  . Cancer [facility]) 2003   Breast cancer    HPI:    Angelica White, 67 y.o. female, has a history of pain and functional disability in the left hip(s) due to arthritis and patient has failed non-surgical conservative treatments for greater than 12 weeks to include NSAID's and/or analgesics, use of assistive devices and activity modification.  Onset of symptoms was gradual starting <1 years ago with rapidlly worsening course since that time.The patient noted no past surgery on the left hip(s).  Patient currently rates pain in the left hip at 10 out of 10 with activity. Patient has night pain, worsening of pain with activity and weight bearing, trendelenberg gait, pain that interfers with activities of daily living and pain with passive range of motion. Patient has evidence of periarticular osteophytes and joint space narrowing by imaging studies. This condition presents safety issues increasing the risk of falls. There is no current active infection.  Risks, benefits and expectations were discussed with the patient.  Risks including but not limited to the risk of anesthesia, blood clots, nerve damage, blood vessel damage, failure of the prosthesis, infection and up to and including death.  Patient understand the risks, benefits and expectations and wishes to proceed with surgery.   PCP: Celene Squibb, MD   Discharged Condition: good  Hospital Course:  Patient underwent the above stated procedure on 08/06/2017. Patient tolerated the  procedure well and brought to the recovery room in good condition and subsequently to the floor.  POD #1 BP: 94/53 ; Pulse: 60 ; Temp: 97.6 F (36.4 C) ; Resp: 17 Patient reports pain as mild, pain controlled. Nausea last night, but doing better this morning.  Looking forward to working with PT.  Ready to be discharged home if she does well with PT. Dorsiflexion/plantar flexion intact, incision: dressing C/D/I, no cellulitis present and compartment soft.   LABS  Basename    HGB     12.4  HCT     36.7   POD #2  BP: 102/55 ; Pulse: 61 ; Temp: 98 F (36.7 C) ; Resp: 15 Patient reports pain as mild.  No significant PONV, feeling a lot better.  Left hip feels good as well, happy Neurovascular intact and incision: dressing C/D/I.   LABS  Basename    HGB     11.5  HCT     34.8    Discharge Exam: General appearance: alert, cooperative and no distress Extremities: Homans sign is negative, no sign of DVT, no edema, redness or tenderness in the calves or thighs and no ulcers, gangrene or trophic changes  Disposition: Home with follow up in 2 weeks   Follow-up Information    Paralee Cancel, MD. Schedule an appointment as soon as possible for a visit in 2 week(s).   Specialty:  Orthopedic Surgery Contact information: [address] [place] [address] [phone]           Discharge Instructions    Call MD / Call 911   Complete by:  As directed    If you experience chest pain or shortness of breath, CALL 911 and be transported to the hospital emergency room.  If you develope a fever above 101 F, pus (white drainage) or increased drainage or redness at the wound, or calf pain, call your surgeon's office.   Change dressing   Complete by:  As directed    Maintain surgical dressing until follow up in the clinic. If the edges start to pull up, may reinforce with tape. If the dressing is no longer working, may remove and cover with gauze and tape, but must keep the  area dry and clean.  Call with any questions or concerns.   Constipation Prevention   Complete by:  As directed    Drink plenty of fluids.  Prune juice may be helpful.  You may use a stool softener, such as Colace (over the counter) 100 mg twice a day.  Use MiraLax (over the counter) for constipation as needed.   Diet - low sodium heart healthy   Complete by:  As directed    Discharge instructions   Complete by:  As directed    Maintain surgical dressing until follow up in the clinic. If the edges start to pull up, may reinforce with tape. If the dressing is no longer working, may remove and cover with gauze and tape, but must keep the area dry and clean.  Follow up in 2 weeks at [facility]. Call with any questions or concerns.   Increase activity slowly as tolerated   Complete by:  As directed    Weight bearing as tolerated with assist device (West, cane, etc) as directed, use it as long as suggested by your surgeon or therapist, typically at least 4-6 weeks.   TED hose   Complete by:  As directed    Use stockings (TED hose) for 2 weeks on both leg(s).  You may remove them at night for sleeping.      Allergies as of 08/08/2017      Reactions   Amoxicillin-pot Clavulanate Hives, Rash      Medication List    STOP taking these medications   diphenhydramine-acetaminophen 25-500 MG Tabs tablet Commonly known as:  TYLENOL PM   ibuprofen 800 MG tablet Commonly known as:  ADVIL,MOTRIN     TAKE these medications   aspirin 81 MG chewable tablet Commonly known as:  ASPIRIN CHILDRENS Chew 1 tablet (81 mg total) by mouth 2 (two) times daily. Start the day after finishing the Xarelto.  Take for 4 weeks. Start taking on:  08/23/2017   docusate sodium 100 MG capsule Commonly known as:  COLACE Take 1 capsule (100 mg total) by mouth 2 (two) times daily.   ferrous sulfate 325 (65 FE) MG tablet Commonly known as:  FERROUSUL Take 1 tablet (325 mg total) by mouth 3 (three) times  daily with meals.   HYDROcodone-acetaminophen 7.5-325 MG tablet Commonly known as:  NORCO Take 1-2 tablets by mouth every 4 (four) hours as needed for moderate pain or severe pain.   methocarbamol 500 MG tablet Commonly known as:  ROBAXIN Take 1 tablet (500 mg total) by mouth every 6 (six) hours as needed for muscle spasms.   polyethylene glycol packet Commonly known as:  MIRALAX / GLYCOLAX Take 17 g by mouth daily.   polyethylene glycol packet Commonly known as:  MIRALAX / GLYCOLAX Take 17 g by mouth 2 (two) times daily.   rivaroxaban 10 MG Tabs tablet Commonly known as:  XARELTO Take 1 tablet (10 mg total) by mouth daily for 14 days.            Discharge Care Instructions  (From admission, onward)        Start     Ordered   08/07/17 0000  Change dressing    Comments:  Maintain surgical dressing until follow up in the clinic. If the edges start to pull up, may reinforce with tape. If the dressing is no longer working, may remove and cover with gauze and tape, but must keep the area dry and clean.  Call with any questions or concerns.   08/07/17 0914       Signed: West Pugh. Deboraha Goar   PA-C  08/09/2017, 5:08 PM

## 2017-09-19 DIAGNOSIS — Z96642 Presence of left artificial hip joint: Secondary | ICD-10-CM | POA: Diagnosis not present

## 2017-09-19 DIAGNOSIS — Z471 Aftercare following joint replacement surgery: Secondary | ICD-10-CM | POA: Diagnosis not present

## 2017-09-30 DIAGNOSIS — R7301 Impaired fasting glucose: Secondary | ICD-10-CM | POA: Diagnosis not present

## 2017-09-30 DIAGNOSIS — E6609 Other obesity due to excess calories: Secondary | ICD-10-CM | POA: Diagnosis not present

## 2017-10-08 DIAGNOSIS — R7301 Impaired fasting glucose: Secondary | ICD-10-CM | POA: Diagnosis not present

## 2017-10-08 DIAGNOSIS — Z0000 Encounter for general adult medical examination without abnormal findings: Secondary | ICD-10-CM | POA: Diagnosis not present

## 2017-10-08 DIAGNOSIS — Z6837 Body mass index (BMI) 37.0-37.9, adult: Secondary | ICD-10-CM | POA: Diagnosis not present

## 2017-10-08 DIAGNOSIS — E669 Obesity, unspecified: Secondary | ICD-10-CM | POA: Diagnosis not present

## 2017-10-08 DIAGNOSIS — M79601 Pain in right arm: Secondary | ICD-10-CM | POA: Diagnosis not present

## 2017-10-30 DIAGNOSIS — Z471 Aftercare following joint replacement surgery: Secondary | ICD-10-CM | POA: Diagnosis not present

## 2017-10-30 DIAGNOSIS — Z96642 Presence of left artificial hip joint: Secondary | ICD-10-CM | POA: Diagnosis not present

## 2018-03-20 ENCOUNTER — Other Ambulatory Visit (HOSPITAL_COMMUNITY): Payer: Self-pay | Admitting: Internal Medicine

## 2018-03-20 DIAGNOSIS — Z1231 Encounter for screening mammogram for malignant neoplasm of breast: Secondary | ICD-10-CM

## 2018-03-21 ENCOUNTER — Encounter (HOSPITAL_COMMUNITY): Payer: Self-pay

## 2018-03-21 ENCOUNTER — Ambulatory Visit (HOSPITAL_COMMUNITY)
Admission: RE | Admit: 2018-03-21 | Discharge: 2018-03-21 | Disposition: A | Discharge status: Home / Self Care | Payer: Medicare Other | Source: Ambulatory Visit | Attending: Internal Medicine | Admitting: Internal Medicine

## 2018-03-21 DIAGNOSIS — Z1231 Encounter for screening mammogram for malignant neoplasm of breast: Secondary | ICD-10-CM | POA: Diagnosis not present

## 2018-05-20 DIAGNOSIS — Z23 Encounter for immunization: Secondary | ICD-10-CM | POA: Diagnosis not present

## 2018-06-13 DIAGNOSIS — M79644 Pain in right finger(s): Secondary | ICD-10-CM | POA: Diagnosis not present

## 2018-06-13 DIAGNOSIS — M79641 Pain in right hand: Secondary | ICD-10-CM | POA: Diagnosis not present

## 2018-08-05 DIAGNOSIS — M79644 Pain in right finger(s): Secondary | ICD-10-CM | POA: Diagnosis not present

## 2018-08-05 DIAGNOSIS — G5601 Carpal tunnel syndrome, right upper limb: Secondary | ICD-10-CM | POA: Diagnosis not present

## 2018-08-07 DIAGNOSIS — Z471 Aftercare following joint replacement surgery: Secondary | ICD-10-CM | POA: Diagnosis not present

## 2018-08-07 DIAGNOSIS — Z96642 Presence of left artificial hip joint: Secondary | ICD-10-CM | POA: Diagnosis not present

## 2018-08-11 DIAGNOSIS — G5601 Carpal tunnel syndrome, right upper limb: Secondary | ICD-10-CM | POA: Diagnosis not present

## 2018-08-19 DIAGNOSIS — G5601 Carpal tunnel syndrome, right upper limb: Secondary | ICD-10-CM | POA: Diagnosis not present

## 2018-08-28 DIAGNOSIS — H524 Presbyopia: Secondary | ICD-10-CM | POA: Diagnosis not present

## 2018-08-28 DIAGNOSIS — H35371 Puckering of macula, right eye: Secondary | ICD-10-CM | POA: Diagnosis not present

## 2018-08-29 DIAGNOSIS — G5601 Carpal tunnel syndrome, right upper limb: Secondary | ICD-10-CM | POA: Diagnosis not present

## 2018-10-08 DIAGNOSIS — E669 Obesity, unspecified: Secondary | ICD-10-CM | POA: Diagnosis not present

## 2018-10-08 DIAGNOSIS — R7301 Impaired fasting glucose: Secondary | ICD-10-CM | POA: Diagnosis not present

## 2018-10-13 DIAGNOSIS — M79601 Pain in right arm: Secondary | ICD-10-CM | POA: Diagnosis not present

## 2018-10-13 DIAGNOSIS — Z0000 Encounter for general adult medical examination without abnormal findings: Secondary | ICD-10-CM | POA: Diagnosis not present

## 2018-10-13 DIAGNOSIS — E669 Obesity, unspecified: Secondary | ICD-10-CM | POA: Diagnosis not present

## 2018-10-13 DIAGNOSIS — L918 Other hypertrophic disorders of the skin: Secondary | ICD-10-CM | POA: Diagnosis not present

## 2018-10-13 DIAGNOSIS — R7301 Impaired fasting glucose: Secondary | ICD-10-CM | POA: Diagnosis not present

## 2018-12-11 DIAGNOSIS — G5601 Carpal tunnel syndrome, right upper limb: Secondary | ICD-10-CM | POA: Diagnosis not present

## 2018-12-19 DIAGNOSIS — R110 Nausea: Secondary | ICD-10-CM | POA: Diagnosis not present

## 2018-12-19 DIAGNOSIS — H8110 Benign paroxysmal vertigo, unspecified ear: Secondary | ICD-10-CM | POA: Diagnosis not present

## 2019-01-05 DIAGNOSIS — L918 Other hypertrophic disorders of the skin: Secondary | ICD-10-CM | POA: Diagnosis not present

## 2019-01-26 IMAGING — DX DG PORTABLE PELVIS
1 series · 1 of 1 positions shown · non-contrast
Comparison: 08/06/2017.

CLINICAL DATA: Left hip replacement.

EXAM:
PORTABLE PELVIS 1-2 VIEWS

[pelvis ap]
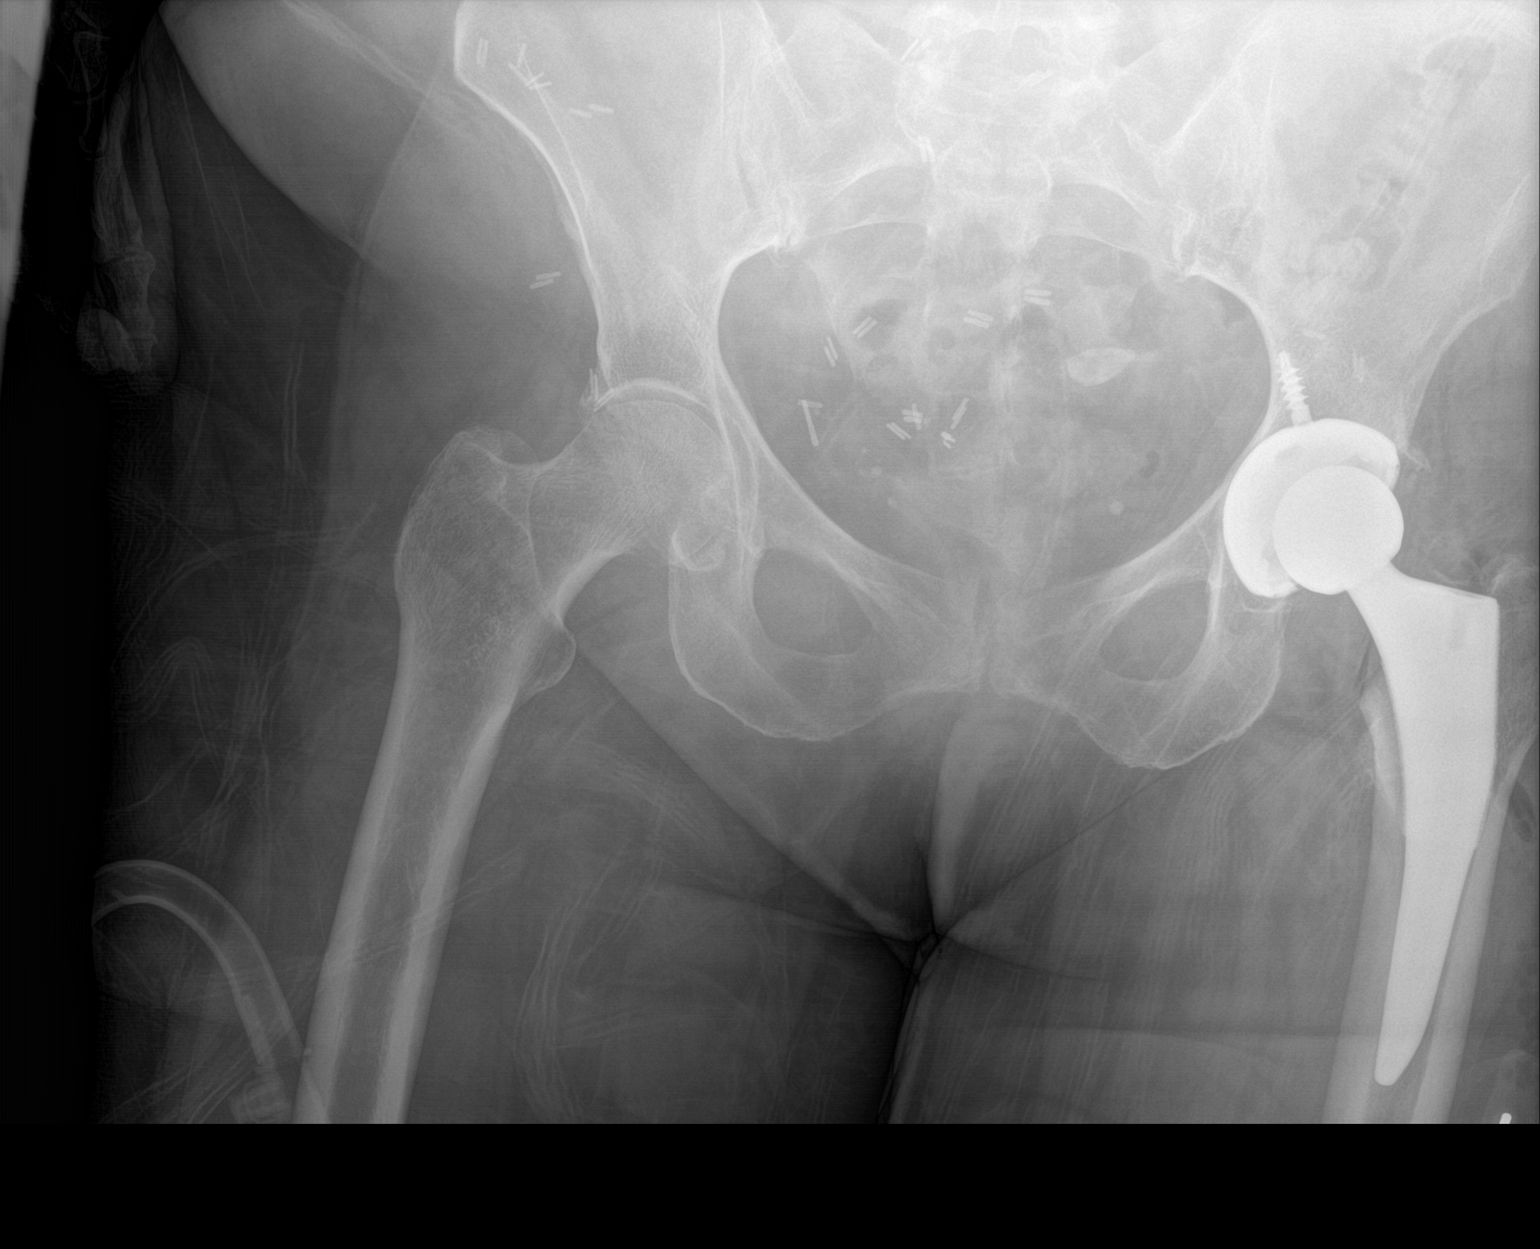

[1 of 1 positions shown; findings below may reference images not displayed]

FINDINGS: Total left hip replacement with anatomic alignment. Hardware intact.
Pelvic calcifications consistent with phleboliths.
IMPRESSION: Total left hip replacement.  Hardware intact.  Anatomic alignment.

## 2019-02-26 ENCOUNTER — Other Ambulatory Visit: Payer: Self-pay

## 2019-03-04 ENCOUNTER — Other Ambulatory Visit (HOSPITAL_COMMUNITY): Payer: Self-pay | Admitting: Adult Health Nurse Practitioner

## 2019-03-04 DIAGNOSIS — Z1231 Encounter for screening mammogram for malignant neoplasm of breast: Secondary | ICD-10-CM

## 2019-03-23 ENCOUNTER — Ambulatory Visit (HOSPITAL_COMMUNITY)
Admission: RE | Admit: 2019-03-23 | Discharge: 2019-03-23 | Disposition: A | Discharge status: Home / Self Care | Payer: Medicare Other | Source: Ambulatory Visit | Attending: Adult Health Nurse Practitioner | Admitting: Adult Health Nurse Practitioner

## 2019-03-23 ENCOUNTER — Encounter (HOSPITAL_COMMUNITY): Payer: Self-pay

## 2019-03-23 ENCOUNTER — Other Ambulatory Visit: Payer: Self-pay

## 2019-03-23 DIAGNOSIS — Z1231 Encounter for screening mammogram for malignant neoplasm of breast: Secondary | ICD-10-CM | POA: Insufficient documentation

## 2019-05-07 DIAGNOSIS — Z23 Encounter for immunization: Secondary | ICD-10-CM | POA: Diagnosis not present

## 2019-06-24 ENCOUNTER — Other Ambulatory Visit: Payer: Self-pay

## 2019-09-14 DIAGNOSIS — Z23 Encounter for immunization: Secondary | ICD-10-CM | POA: Diagnosis not present

## 2019-10-08 DIAGNOSIS — H43391 Other vitreous opacities, right eye: Secondary | ICD-10-CM | POA: Diagnosis not present

## 2019-10-08 DIAGNOSIS — H524 Presbyopia: Secondary | ICD-10-CM | POA: Diagnosis not present

## 2019-10-12 DIAGNOSIS — Z23 Encounter for immunization: Secondary | ICD-10-CM | POA: Diagnosis not present

## 2019-10-19 DIAGNOSIS — Z6837 Body mass index (BMI) 37.0-37.9, adult: Secondary | ICD-10-CM | POA: Diagnosis not present

## 2019-10-19 DIAGNOSIS — L918 Other hypertrophic disorders of the skin: Secondary | ICD-10-CM | POA: Diagnosis not present

## 2019-10-19 DIAGNOSIS — M79601 Pain in right arm: Secondary | ICD-10-CM | POA: Diagnosis not present

## 2019-10-19 DIAGNOSIS — R7301 Impaired fasting glucose: Secondary | ICD-10-CM | POA: Diagnosis not present

## 2019-10-19 DIAGNOSIS — R110 Nausea: Secondary | ICD-10-CM | POA: Diagnosis not present

## 2019-10-19 DIAGNOSIS — H8110 Benign paroxysmal vertigo, unspecified ear: Secondary | ICD-10-CM | POA: Diagnosis not present

## 2019-10-19 DIAGNOSIS — M25559 Pain in unspecified hip: Secondary | ICD-10-CM | POA: Diagnosis not present

## 2019-10-19 DIAGNOSIS — Z0000 Encounter for general adult medical examination without abnormal findings: Secondary | ICD-10-CM | POA: Diagnosis not present

## 2019-10-19 DIAGNOSIS — M79644 Pain in right finger(s): Secondary | ICD-10-CM | POA: Diagnosis not present

## 2019-10-19 DIAGNOSIS — M79641 Pain in right hand: Secondary | ICD-10-CM | POA: Diagnosis not present

## 2019-10-19 DIAGNOSIS — E669 Obesity, unspecified: Secondary | ICD-10-CM | POA: Diagnosis not present

## 2019-10-20 DIAGNOSIS — D485 Neoplasm of uncertain behavior of skin: Secondary | ICD-10-CM | POA: Diagnosis not present

## 2019-10-20 DIAGNOSIS — H02403 Unspecified ptosis of bilateral eyelids: Secondary | ICD-10-CM | POA: Diagnosis not present

## 2019-10-26 DIAGNOSIS — Z6839 Body mass index (BMI) 39.0-39.9, adult: Secondary | ICD-10-CM | POA: Diagnosis not present

## 2019-10-26 DIAGNOSIS — E669 Obesity, unspecified: Secondary | ICD-10-CM | POA: Diagnosis not present

## 2019-10-26 DIAGNOSIS — Z6837 Body mass index (BMI) 37.0-37.9, adult: Secondary | ICD-10-CM | POA: Diagnosis not present

## 2019-10-26 DIAGNOSIS — R7301 Impaired fasting glucose: Secondary | ICD-10-CM | POA: Diagnosis not present

## 2019-10-26 DIAGNOSIS — R600 Localized edema: Secondary | ICD-10-CM | POA: Diagnosis not present

## 2019-10-26 DIAGNOSIS — Z0001 Encounter for general adult medical examination with abnormal findings: Secondary | ICD-10-CM | POA: Diagnosis not present

## 2019-10-26 DIAGNOSIS — M25559 Pain in unspecified hip: Secondary | ICD-10-CM | POA: Diagnosis not present

## 2019-10-26 DIAGNOSIS — R7303 Prediabetes: Secondary | ICD-10-CM | POA: Diagnosis not present

## 2019-10-26 DIAGNOSIS — E782 Mixed hyperlipidemia: Secondary | ICD-10-CM | POA: Diagnosis not present

## 2019-11-11 DIAGNOSIS — Z1211 Encounter for screening for malignant neoplasm of colon: Secondary | ICD-10-CM | POA: Diagnosis not present

## 2019-11-15 LAB — COLOGUARD: COLOGUARD: NEGATIVE

## 2020-02-17 ENCOUNTER — Other Ambulatory Visit (HOSPITAL_COMMUNITY): Payer: Self-pay | Admitting: Adult Health Nurse Practitioner

## 2020-02-17 DIAGNOSIS — Z1231 Encounter for screening mammogram for malignant neoplasm of breast: Secondary | ICD-10-CM

## 2020-03-25 ENCOUNTER — Other Ambulatory Visit: Payer: Self-pay

## 2020-03-25 ENCOUNTER — Ambulatory Visit (HOSPITAL_COMMUNITY)
Admission: RE | Admit: 2020-03-25 | Discharge: 2020-03-25 | Disposition: A | Discharge status: Home / Self Care | Payer: Medicare Other | Source: Ambulatory Visit | Attending: Adult Health Nurse Practitioner | Admitting: Adult Health Nurse Practitioner

## 2020-03-25 DIAGNOSIS — Z1231 Encounter for screening mammogram for malignant neoplasm of breast: Secondary | ICD-10-CM | POA: Diagnosis not present

## 2020-04-20 DIAGNOSIS — Z6838 Body mass index (BMI) 38.0-38.9, adult: Secondary | ICD-10-CM | POA: Diagnosis not present

## 2020-04-20 DIAGNOSIS — R7301 Impaired fasting glucose: Secondary | ICD-10-CM | POA: Diagnosis not present

## 2020-04-20 DIAGNOSIS — Z6839 Body mass index (BMI) 39.0-39.9, adult: Secondary | ICD-10-CM | POA: Diagnosis not present

## 2020-04-20 DIAGNOSIS — E782 Mixed hyperlipidemia: Secondary | ICD-10-CM | POA: Diagnosis not present

## 2020-04-20 DIAGNOSIS — M79641 Pain in right hand: Secondary | ICD-10-CM | POA: Diagnosis not present

## 2020-04-20 DIAGNOSIS — R110 Nausea: Secondary | ICD-10-CM | POA: Diagnosis not present

## 2020-04-20 DIAGNOSIS — R600 Localized edema: Secondary | ICD-10-CM | POA: Diagnosis not present

## 2020-04-20 DIAGNOSIS — E669 Obesity, unspecified: Secondary | ICD-10-CM | POA: Diagnosis not present

## 2020-04-20 DIAGNOSIS — M79601 Pain in right arm: Secondary | ICD-10-CM | POA: Diagnosis not present

## 2020-04-20 DIAGNOSIS — R7303 Prediabetes: Secondary | ICD-10-CM | POA: Diagnosis not present

## 2020-04-20 DIAGNOSIS — M25559 Pain in unspecified hip: Secondary | ICD-10-CM | POA: Diagnosis not present

## 2020-04-20 DIAGNOSIS — M79644 Pain in right finger(s): Secondary | ICD-10-CM | POA: Diagnosis not present

## 2020-04-21 ENCOUNTER — Other Ambulatory Visit: Payer: Medicare Other

## 2020-04-21 ENCOUNTER — Other Ambulatory Visit: Payer: Self-pay | Admitting: Internal Medicine

## 2020-04-21 DIAGNOSIS — Z20822 Contact with and (suspected) exposure to covid-19: Secondary | ICD-10-CM | POA: Diagnosis not present

## 2020-04-23 ENCOUNTER — Telehealth: Payer: Self-pay

## 2020-04-23 LAB — SARS-COV-2, NAA 2 DAY TAT

## 2020-04-23 LAB — SPECIMEN STATUS REPORT

## 2020-04-23 LAB — NOVEL CORONAVIRUS, NAA: SARS-CoV-2, NAA: DETECTED — AB

## 2020-04-23 NOTE — Telephone Encounter (Signed)
Pt notified of positive COVID-19 test results. Pt verbalized understanding. Pt reports that they are feeling fatigue,.Pt advised to remain in self quarantine until at least 10 days since symptom onset And at least 24 hours fever free without antipyretics And improvement in respiratory symptoms. Patient advised to utilize over the counter medications to treat symptoms. Pt advised to seek treatment in the ED if respiratory issues/distress develops.Pt advised they should only leave home to seek and medical care and must wear a mask in public. Pt instructed to limit contact with family members or caregivers in the home. Pt advised to practice social distancing and to continue to use good preventative care measures such has frequent hand washing, staying out of crowds and cleaning hard surfaces frequently touched in the home.Pt informed that the health department will likely follow up and may have additional recommendations. Will notify [facility].

## 2020-04-24 ENCOUNTER — Ambulatory Visit (HOSPITAL_COMMUNITY)
Admission: RE | Admit: 2020-04-24 | Discharge: 2020-04-24 | Disposition: A | Discharge status: Home / Self Care | Payer: Medicare Other | Source: Ambulatory Visit | Attending: Pulmonary Disease | Admitting: Pulmonary Disease

## 2020-04-24 ENCOUNTER — Other Ambulatory Visit: Payer: Self-pay | Admitting: Physician Assistant

## 2020-04-24 DIAGNOSIS — E663 Overweight: Secondary | ICD-10-CM | POA: Diagnosis not present

## 2020-04-24 DIAGNOSIS — R54 Age-related physical debility: Secondary | ICD-10-CM | POA: Diagnosis not present

## 2020-04-24 DIAGNOSIS — U071 COVID-19: Secondary | ICD-10-CM | POA: Diagnosis not present

## 2020-04-24 DIAGNOSIS — Z23 Encounter for immunization: Secondary | ICD-10-CM | POA: Insufficient documentation

## 2020-04-24 MED ORDER — ALBUTEROL SULFATE HFA 108 (90 BASE) MCG/ACT IN AERS: 2.0000 | INHALATION_SPRAY | Freq: Once | RESPIRATORY_TRACT | Status: DC | PRN

## 2020-04-24 MED ORDER — SODIUM CHLORIDE 0.9 % IV SOLN
1200.0000 mg | Freq: Once | INTRAVENOUS | Status: AC
  Administered 2020-04-24: 14:00:00 1200 mg via INTRAVENOUS

## 2020-04-24 MED ORDER — DIPHENHYDRAMINE HCL 50 MG/ML IJ SOLN: 50.0000 mg | Freq: Once | INTRAMUSCULAR | Status: DC | PRN

## 2020-04-24 MED ORDER — METHYLPREDNISOLONE SODIUM SUCC 125 MG IJ SOLR: 125.0000 mg | Freq: Once | INTRAMUSCULAR | Status: DC | PRN

## 2020-04-24 MED ORDER — FAMOTIDINE IN NACL 20-0.9 MG/50ML-% IV SOLN: 20.0000 mg | Freq: Once | INTRAVENOUS | Status: DC | PRN

## 2020-04-24 MED ORDER — SODIUM CHLORIDE 0.9 % IV SOLN: INTRAVENOUS | Status: DC | PRN

## 2020-04-24 MED ORDER — EPINEPHRINE 0.3 MG/0.3ML IJ SOAJ: 0.3000 mg | Freq: Once | INTRAMUSCULAR | Status: DC | PRN

## 2020-04-24 NOTE — Discharge Instructions (Signed)

## 2020-04-24 NOTE — Progress Notes (Signed)
  Diagnosis: COVID-19  Physician: Dr. Wright   Procedure: Covid Infusion Clinic Med: casirivimab\imdevimab infusion - Provided patient with casirivimab\imdevimab fact sheet for patients, parents and caregivers prior to infusion.  Complications: No immediate complications noted.  Discharge: Discharged home   Angelica Rickerson  White 04/24/2020   

## 2020-04-24 NOTE — Progress Notes (Signed)
I connected by phone with Angelica White on 04/24/2020 at 9:00 AM to discuss the potential use of a new treatment for mild to moderate COVID-19 viral infection in non-hospitalized patients.  This patient is a 69 y.o. female that meets the FDA criteria for Emergency Use Authorization of COVID monoclonal antibody casirivimab/imdevimab or bamlanivimab/eteseviamb.  Has a (+) direct SARS-CoV-2 viral test result  Has mild or moderate COVID-19   Is NOT hospitalized due to COVID-19  Is within 10 days of symptom onset  Has at least one of the high risk factor(s) for progression to severe COVID-19 and/or hospitalization as defined in EUA.  Specific high risk criteria : Older age (>/= 69 yo), BMI > 25 and Other high risk medical condition per CDC:  hx of cancer   I have spoken and communicated the following to the patient or parent/caregiver regarding COVID monoclonal antibody treatment:  1. FDA has authorized the emergency use for the treatment of mild to moderate COVID-19 in adults and pediatric patients with positive results of direct SARS-CoV-2 viral testing who are 94 years of age and older weighing at least 40 kg, and who are at high risk for progressing to severe COVID-19 and/or hospitalization.  2. The significant known and potential risks and benefits of COVID monoclonal antibody, and the extent to which such potential risks and benefits are unknown.  3. Information on available alternative treatments and the risks and benefits of those alternatives, including clinical trials.  4. Patients treated with COVID monoclonal antibody should continue to self-isolate and use infection control measures (e.g., wear mask, isolate, social distance, avoid sharing personal items, clean and disinfect "high touch" surfaces, and frequent handwashing) according to CDC guidelines.   5. The patient or parent/caregiver has the option to accept or refuse COVID monoclonal antibody treatment.  After reviewing  this information with the patient, the patient has agreed to receive one of the available covid 19 monoclonal antibodies and will be provided an appropriate fact sheet prior to infusion. [name], PA 04/24/2020 9:00 AM

## 2020-04-25 DIAGNOSIS — R7301 Impaired fasting glucose: Secondary | ICD-10-CM | POA: Diagnosis not present

## 2020-04-25 DIAGNOSIS — U071 COVID-19: Secondary | ICD-10-CM | POA: Diagnosis not present

## 2020-04-25 DIAGNOSIS — E669 Obesity, unspecified: Secondary | ICD-10-CM | POA: Diagnosis not present

## 2020-04-25 DIAGNOSIS — R945 Abnormal results of liver function studies: Secondary | ICD-10-CM | POA: Diagnosis not present

## 2020-04-25 DIAGNOSIS — R7303 Prediabetes: Secondary | ICD-10-CM | POA: Diagnosis not present

## 2020-04-25 DIAGNOSIS — E782 Mixed hyperlipidemia: Secondary | ICD-10-CM | POA: Diagnosis not present

## 2020-04-25 DIAGNOSIS — Z6839 Body mass index (BMI) 39.0-39.9, adult: Secondary | ICD-10-CM | POA: Diagnosis not present

## 2020-04-25 DIAGNOSIS — E559 Vitamin D deficiency, unspecified: Secondary | ICD-10-CM | POA: Diagnosis not present

## 2020-04-25 DIAGNOSIS — R600 Localized edema: Secondary | ICD-10-CM | POA: Diagnosis not present

## 2020-06-20 DIAGNOSIS — Z23 Encounter for immunization: Secondary | ICD-10-CM | POA: Diagnosis not present

## 2020-09-11 IMAGING — MG DIGITAL SCREENING BILATERAL MAMMOGRAM WITH TOMO AND CAD
8 series · 8 of 24 positions shown · non-contrast
Comparison: Previous exam(s).

CLINICAL DATA: Screening.

EXAM:
DIGITAL SCREENING BILATERAL MAMMOGRAM WITH TOMO AND CAD

[R CC synth-2D]
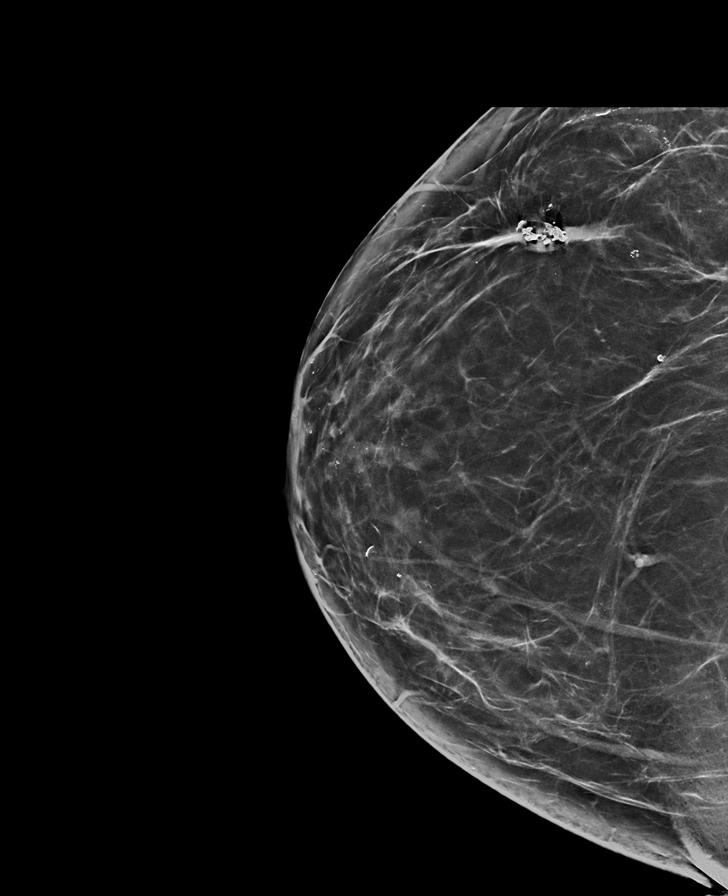

[R MLO synth-2D]
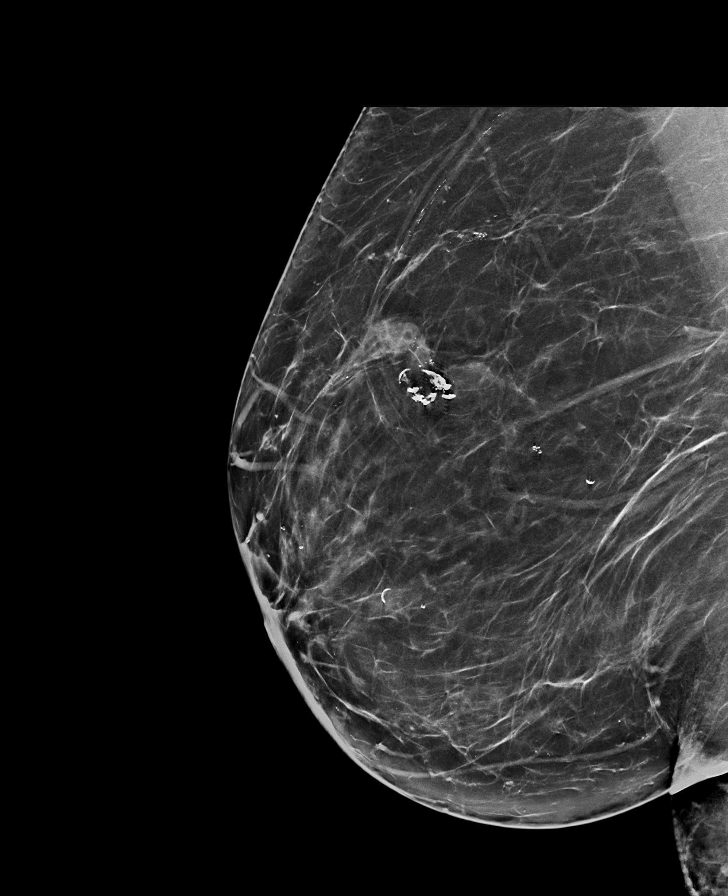

[L MLO synth-2D]
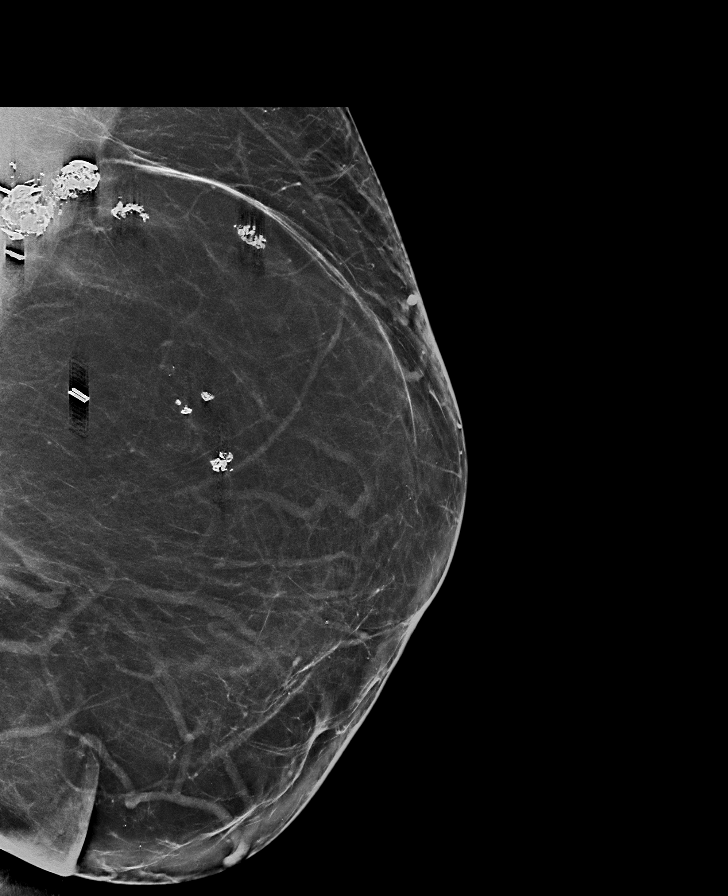

[L CC synth-2D]
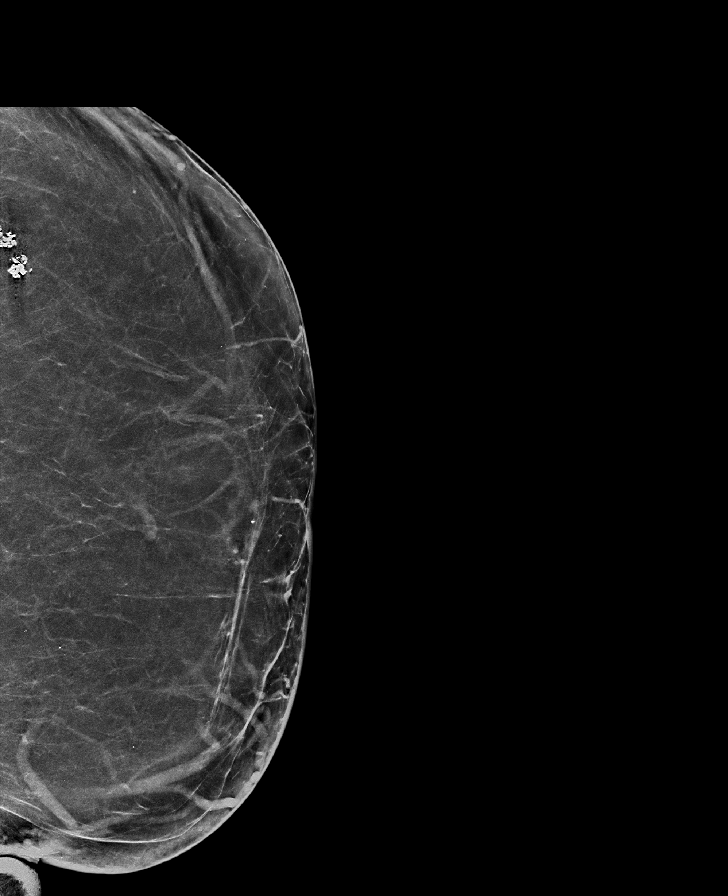

[R CC tomo · tomo slice 43/86.0]
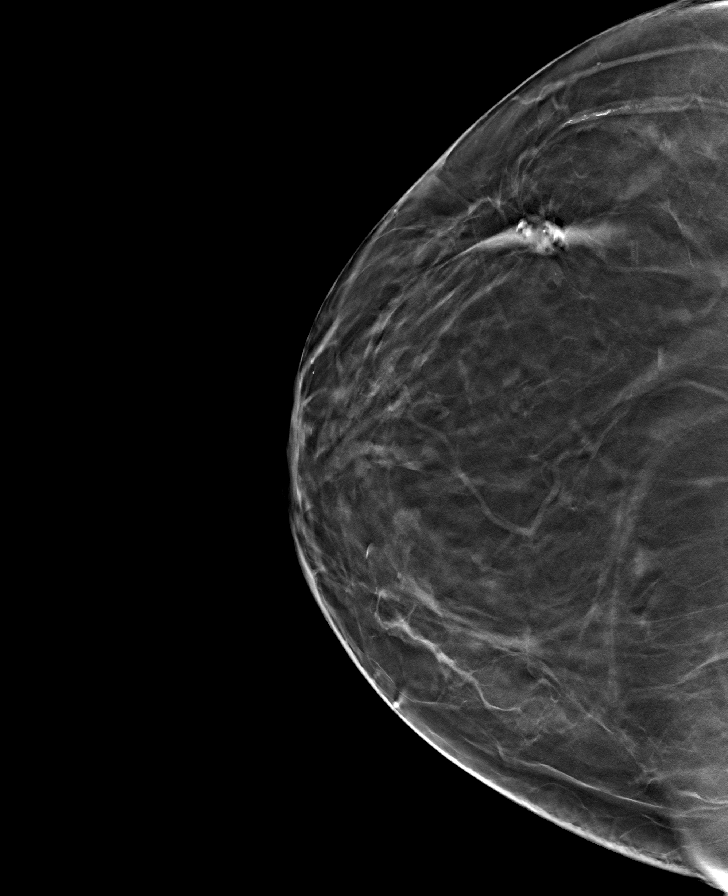

[R MLO tomo · tomo slice 43/84.0]
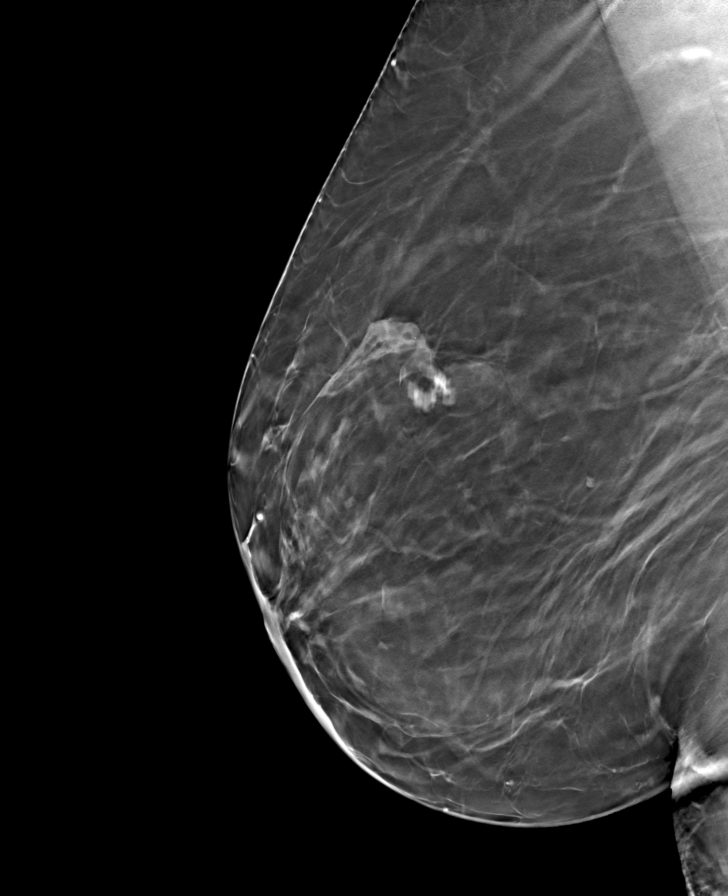

[L MLO tomo · tomo slice 44/87.0]
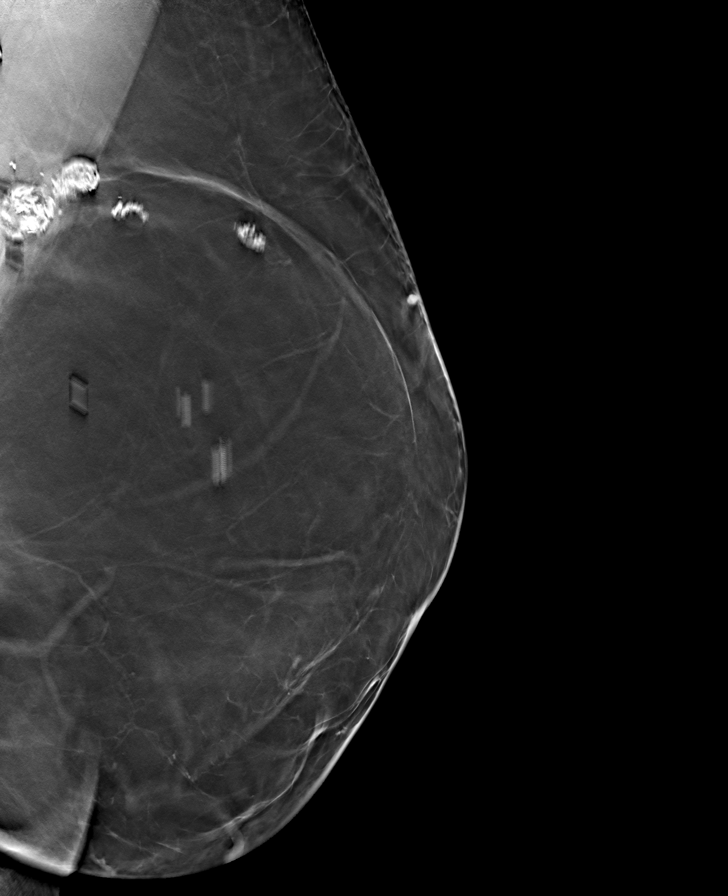

[L CC tomo · tomo slice 39/78.0]
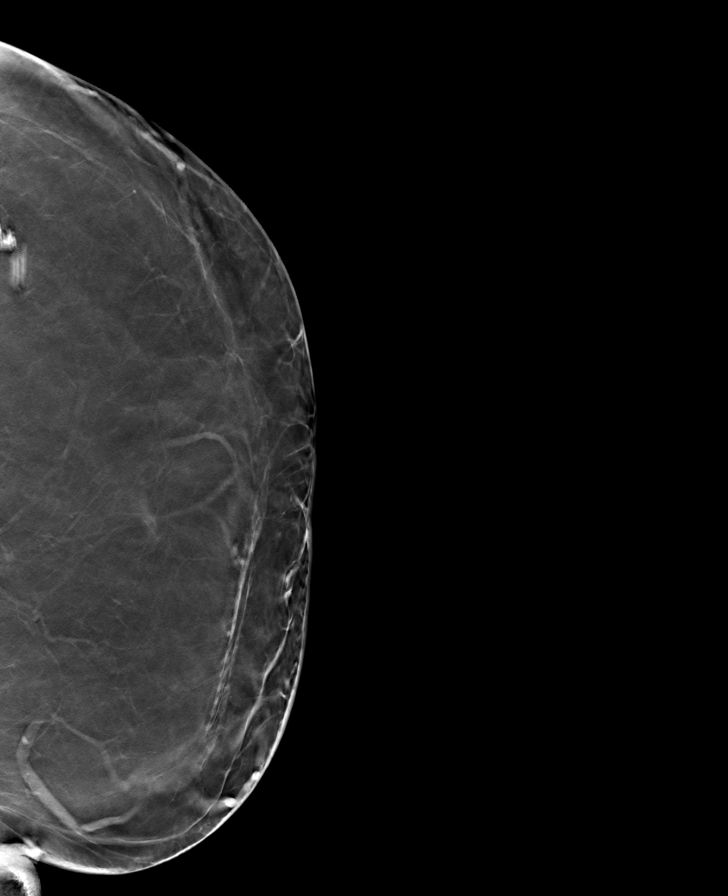

[8 of 24 positions shown; findings below may reference images not displayed]

ACR Breast Density Category b: There are scattered areas of
fibroglandular density.
FINDINGS: There are no findings suspicious for malignancy. Images were
processed with CAD.
IMPRESSION: No mammographic evidence of malignancy. A result letter of this
screening mammogram will be mailed directly to the patient.

RECOMMENDATION:
Screening mammogram in one year. (Code:[id number])

BI-RADS CATEGORY  1: Negative.

## 2020-10-03 DIAGNOSIS — R7301 Impaired fasting glucose: Secondary | ICD-10-CM | POA: Diagnosis not present

## 2020-10-03 DIAGNOSIS — Z0001 Encounter for general adult medical examination with abnormal findings: Secondary | ICD-10-CM | POA: Diagnosis not present

## 2020-10-03 DIAGNOSIS — E559 Vitamin D deficiency, unspecified: Secondary | ICD-10-CM | POA: Diagnosis not present

## 2020-10-03 DIAGNOSIS — R7303 Prediabetes: Secondary | ICD-10-CM | POA: Diagnosis not present

## 2020-10-03 DIAGNOSIS — R945 Abnormal results of liver function studies: Secondary | ICD-10-CM | POA: Diagnosis not present

## 2020-10-03 DIAGNOSIS — E782 Mixed hyperlipidemia: Secondary | ICD-10-CM | POA: Diagnosis not present

## 2020-10-10 DIAGNOSIS — E559 Vitamin D deficiency, unspecified: Secondary | ICD-10-CM | POA: Diagnosis not present

## 2020-10-10 DIAGNOSIS — R7301 Impaired fasting glucose: Secondary | ICD-10-CM | POA: Diagnosis not present

## 2020-10-10 DIAGNOSIS — R945 Abnormal results of liver function studies: Secondary | ICD-10-CM | POA: Diagnosis not present

## 2020-10-10 DIAGNOSIS — E669 Obesity, unspecified: Secondary | ICD-10-CM | POA: Diagnosis not present

## 2020-10-10 DIAGNOSIS — R7303 Prediabetes: Secondary | ICD-10-CM | POA: Diagnosis not present

## 2020-10-10 DIAGNOSIS — E782 Mixed hyperlipidemia: Secondary | ICD-10-CM | POA: Diagnosis not present

## 2020-10-10 DIAGNOSIS — R600 Localized edema: Secondary | ICD-10-CM | POA: Diagnosis not present

## 2020-10-10 DIAGNOSIS — Z6839 Body mass index (BMI) 39.0-39.9, adult: Secondary | ICD-10-CM | POA: Diagnosis not present

## 2020-10-20 DIAGNOSIS — H524 Presbyopia: Secondary | ICD-10-CM | POA: Diagnosis not present

## 2020-10-20 DIAGNOSIS — Z961 Presence of intraocular lens: Secondary | ICD-10-CM | POA: Diagnosis not present

## 2020-10-25 ENCOUNTER — Emergency Department (HOSPITAL_COMMUNITY)
Admission: EM | Admit: 2020-10-25 | Discharge: 2020-10-25 | Disposition: A | Discharge status: Home / Self Care | Payer: Medicare Other | Attending: Emergency Medicine | Admitting: Emergency Medicine

## 2020-10-25 ENCOUNTER — Emergency Department (HOSPITAL_COMMUNITY): Payer: Medicare Other

## 2020-10-25 ENCOUNTER — Other Ambulatory Visit: Payer: Self-pay

## 2020-10-25 ENCOUNTER — Encounter (HOSPITAL_COMMUNITY): Payer: Self-pay | Admitting: *Deleted

## 2020-10-25 DIAGNOSIS — R9431 Abnormal electrocardiogram [ECG] [EKG]: Secondary | ICD-10-CM | POA: Diagnosis not present

## 2020-10-25 DIAGNOSIS — M79662 Pain in left lower leg: Secondary | ICD-10-CM | POA: Diagnosis not present

## 2020-10-25 DIAGNOSIS — Z7982 Long term (current) use of aspirin: Secondary | ICD-10-CM | POA: Diagnosis not present

## 2020-10-25 DIAGNOSIS — M79605 Pain in left leg: Secondary | ICD-10-CM | POA: Diagnosis not present

## 2020-10-25 DIAGNOSIS — M7989 Other specified soft tissue disorders: Secondary | ICD-10-CM | POA: Diagnosis not present

## 2020-10-25 DIAGNOSIS — Z96642 Presence of left artificial hip joint: Secondary | ICD-10-CM | POA: Insufficient documentation

## 2020-10-25 DIAGNOSIS — Z853 Personal history of malignant neoplasm of breast: Secondary | ICD-10-CM | POA: Diagnosis not present

## 2020-10-25 LAB — CBC WITH DIFFERENTIAL/PLATELET
Abs Immature Granulocytes: 0.01 10*3/uL (ref 0.00–0.07)
Basophils Absolute: 0 10*3/uL (ref 0.0–0.1)
Basophils Relative: 1 %
Eosinophils Absolute: 0.2 10*3/uL (ref 0.0–0.5)
Eosinophils Relative: 2 %
HCT: 44.4 % (ref 36.0–46.0)
Hemoglobin: 14.7 g/dL (ref 12.0–15.0)
Immature Granulocytes: 0 %
Lymphocytes Relative: 32 %
Lymphs Abs: 2 10*3/uL (ref 0.7–4.0)
MCH: 33 pg (ref 26.0–34.0)
MCHC: 33.1 g/dL (ref 30.0–36.0)
MCV: 99.6 fL (ref 80.0–100.0)
Monocytes Absolute: 0.6 10*3/uL (ref 0.1–1.0)
Monocytes Relative: 10 %
Neutro Abs: 3.5 10*3/uL (ref 1.7–7.7)
Neutrophils Relative %: 55 %
Platelets: 255 10*3/uL (ref 150–400)
RBC: 4.46 MIL/uL (ref 3.87–5.11)
RDW: 12.8 % (ref 11.5–15.5)
WBC: 6.3 10*3/uL (ref 4.0–10.5)
nRBC: 0 % (ref 0.0–0.2)

## 2020-10-25 LAB — BASIC METABOLIC PANEL
Anion gap: 8 (ref 5–15)
BUN: 16 mg/dL (ref 8–23)
CO2: 24 mmol/L (ref 22–32)
Calcium: 9.9 mg/dL (ref 8.9–10.3)
Chloride: 106 mmol/L (ref 98–111)
Creatinine, Ser: 0.72 mg/dL (ref 0.44–1.00)
GFR, Estimated: >60 mL/min (ref >60)
Glucose, Bld: 113 mg/dL — ABNORMAL HIGH (ref 70–99)
Potassium: 3.8 mmol/L (ref 3.5–5.1)
Sodium: 138 mmol/L (ref 135–145)

## 2020-10-25 LAB — TROPONIN I (HIGH SENSITIVITY): Troponin I (High Sensitivity): 4 ng/L (ref <18)

## 2020-10-25 NOTE — ED Triage Notes (Addendum)
Pt c/o pain in left calf that started last night. Pt reports this morning she started to feel SOB and having nausea. Pt normally does not have any SOB at all. Hx of several blood clots. Denies use of blood thinners.

## 2020-10-25 NOTE — Discharge Instructions (Addendum)
You were evaluated in the emergency department today for your left calf pain.  Your vital signs, blood work, and ultrasound were very reassuring.  There is no evidence of blood clot in your leg.  Additionally you are not experiencing shortness of breath or chest pain which are very good signs.  While the exact cause of your symptoms remains unclear, there does not appear to be any emergent problem as a source of your pain.  You are likely experiencing calf pain secondary to your Achilles tendon issue.  I recommend you follow-up closely with your podiatrist as we discussed.  You may utilize over-the-counter pain medication as needed and I recommend you wear your support boot as prescribed by your podiatrist.  Return to emergency department if develop any redness, swelling, warmth to touch of your left calf, numbness, tingling, weakness in your leg, chest pain, palpitations, shortness of breath, or any other severe symptoms.

## 2020-10-25 NOTE — ED Provider Notes (Signed)
[facility] EMERGENCY DEPARTMENT Provider Note   CSN: [id number] Arrival date & time: 10/25/20  1005     History Chief Complaint  Patient presents with  . Leg Pain    Angelica White is a 70 y.o. female with history of DVT in 2003 the left lower extremity immediately following completion of treatment for breast cancer.  She has been in remission for breast cancer since that time and has not had any further blood clots, however she states she has been having aching pain, warm to touch, and swelling of the left calf for the last 3 days, worsening last night with 10/10 pain.  She states she took some aspirin at home earlier this morning ibuprofen to help with the pain, however was concerned that I am blood clot may return as she states that this feels very similar to her prior blood clot pain.  Denies any numbness, tingling, weakness in the lower extremities denies any recent travel, prolonged immobilization, surgical procedures.  Does endorse history of malignancy and blood clots in the past.  She is not currently on any anticoagulation but was on Xarelto following her DVT several years ago.  Pain exacerbated with ambulation, or pressure on the leg, relieved with rest.   She endorses anxiety this morning when pain was most severe, endorses nausea when ambulating on her leg this morning, secondary to pain.  She denies any shortness of breath, chest tightness, palpitations, chest pain at this time.  I personally viewed the patient's medical records.  She has hx of breast cancer, but otherwise carries no medical diagnoses and is not on any medications every day.   Additionally patient has history of multiple surgeries on her feet to "lengthen the ligaments".  She has history of left Achilles tendon disorder, pending surgery for elongation of her Achilles tendon.  She has supportive but she was at home with flareups, however has not worn it in this episode.  HPI     Past Medical History:   Diagnosis Date  . Cancer ([place]) 2003   Lt. Breast cancer    Patient Active Problem List   Diagnosis Date Noted  . Obese 08/07/2017  . S/P left THA, AA 08/06/2017    Past Surgical History:  Procedure Laterality Date  . AUGMENTATION MAMMAPLASTY  2003  . BREAST SURGERY Left 2003  . CARPAL TUNNEL RELEASE Left   . CATARACT EXTRACTION, BILATERAL  2018  . CHOLECYSTECTOMY    . COLONOSCOPY N/A 12/27/2016   Procedure: COLONOSCOPY;  Surgeon: Daneil Dolin, MD;  Location: [facility];  Service: Endoscopy;  Laterality: N/A;  930   . LIPOSUCTION  2003  . TOTAL HIP ARTHROPLASTY Left 08/06/2017   Procedure: LEFT TOTAL HIP ARTHROPLASTY ANTERIOR APPROACH;  Surgeon: Paralee Cancel, MD;  Location: [facility];  Service: Orthopedics;  Laterality: Left;  70 mins     OB History   No obstetric history on file.     No family history on file.  Social History   Tobacco Use  . Smoking status: Never Smoker  . Smokeless tobacco: Never Used  Vaping Use  . Vaping Use: Never used  Substance Use Topics  . Alcohol use: No  . Drug use: No    Home Medications Prior to Admission medications   Medication Sig Start Date End Date Taking? Authorizing Provider  aspirin EC 81 MG tablet Take 81 mg by mouth daily as needed. Swallow whole.   Yes [provider]  ibuprofen (ADVIL) 200 MG tablet  Take 600 mg by mouth every 6 (six) hours as needed.   Yes [provider]  docusate sodium (COLACE) 100 MG capsule Take 1 capsule (100 mg total) by mouth 2 (two) times daily. Patient not taking: No sig reported 08/06/17   Danae Orleans, PA-C  ferrous sulfate (FERROUSUL) 325 (65 FE) MG tablet Take 1 tablet (325 mg total) by mouth 3 (three) times daily with meals. Patient not taking: No sig reported 08/06/17   Danae Orleans, PA-C  HYDROcodone-acetaminophen (NORCO) 7.5-325 MG tablet Take 1-2 tablets by mouth every 4 (four) hours as needed for moderate pain or severe pain. Patient not taking: No sig reported  08/06/17   Danae Orleans, PA-C  methocarbamol (ROBAXIN) 500 MG tablet Take 1 tablet (500 mg total) by mouth every 6 (six) hours as needed for muscle spasms. Patient not taking: No sig reported 08/06/17   Danae Orleans, PA-C  polyethylene glycol (MIRALAX / GLYCOLAX) packet Take 17 g by mouth daily. Patient not taking: No sig reported 08/06/17   Danae Orleans, PA-C  polyethylene glycol (MIRALAX / GLYCOLAX) packet Take 17 g by mouth 2 (two) times daily. Patient not taking: No sig reported 08/06/17   Danae Orleans, PA-C  rivaroxaban (XARELTO) 10 MG TABS tablet Take 1 tablet (10 mg total) by mouth daily for 14 days. 08/08/17 08/22/17  Danae Orleans, PA-C    Allergies    Amoxicillin-pot clavulanate  Review of Systems   Review of Systems  Constitutional: Negative.   HENT: Negative.   Respiratory: Positive for shortness of breath. Negative for cough and chest tightness.   Cardiovascular: Negative for chest pain, palpitations and leg swelling.  Gastrointestinal: Negative.   Genitourinary: Negative.   Musculoskeletal: Positive for myalgias.       LLE swelling  Neurological: Negative.   Hematological: Negative.    Physical Exam Updated Vital Signs BP 121/77   Pulse (!) 59   Temp 97.7 F (36.5 C) (Oral)   Resp 18   Ht 5\' 2"  (1.575 m)   Wt 95.3 kg   SpO2 96%   BMI 38.41 kg/m   Physical Exam Vitals and nursing note reviewed.  HENT:     Head: Normocephalic and atraumatic.     Nose: Nose normal.     Mouth/Throat:     Mouth: Mucous membranes are moist.     Pharynx: Oropharynx is clear. No oropharyngeal exudate or posterior oropharyngeal erythema.  Eyes:     General:        Right eye: No discharge.        Left eye: No discharge.     Extraocular Movements: Extraocular movements intact.     Conjunctiva/sclera: Conjunctivae normal.     Pupils: Pupils are equal, round, and reactive to light.  Cardiovascular:     Rate and Rhythm: Normal rate and regular rhythm.     Pulses: Normal  pulses.     Heart sounds: Normal heart sounds. No murmur heard.   Pulmonary:     Effort: Pulmonary effort is normal. No respiratory distress.     Breath sounds: Normal breath sounds. No wheezing or rales.  Abdominal:     General: Bowel sounds are normal. There is no distension.     Palpations: Abdomen is soft.     Tenderness: There is no abdominal tenderness. There is no guarding or rebound.  Musculoskeletal:        General: No deformity.     Cervical back: Neck supple.     Right hip: Normal.  Left hip: Normal.     Right upper leg: Normal.     Left upper leg: Normal.     Right knee: Normal.     Left knee: Normal.     Right lower leg: Normal. No edema.     Left lower leg: Tenderness present. No swelling or bony tenderness. No edema.     Right ankle: Normal.     Right Achilles Tendon: Normal.     Left ankle:     Left Achilles Tendon: Tenderness present.     Right foot: Normal.     Left foot: Normal. Normal capillary refill. Normal pulse.       Legs:  Skin:    General: Skin is warm and dry.     Capillary Refill: Capillary refill takes less than 2 seconds.  Neurological:     General: No focal deficit present.     Mental Status: She is alert and oriented to person, place, and time. Mental status is at baseline.  Psychiatric:        Mood and Affect: Mood normal.     ED Results / Procedures / Treatments   Labs (all labs ordered are listed, but only abnormal results are displayed) Labs Reviewed  BASIC METABOLIC PANEL - Abnormal; Notable for the following components:      Result Value   Glucose, Bld 113 (*)    All other components within normal limits  CBC WITH DIFFERENTIAL/PLATELET  TROPONIN I (HIGH SENSITIVITY)  TROPONIN I (HIGH SENSITIVITY)    EKG EKG: normal sinus rhythm, no STEMI.  Radiology US Venous Img Lower  Left (DVT Study)  Result Date: 10/25/2020 CLINICAL DATA:  The calf pain and swelling EXAM: Left LOWER EXTREMITY VENOUS DOPPLER ULTRASOUND TECHNIQUE:  Gray-scale sonography with compression, as well as color and duplex ultrasound, were performed to evaluate the deep venous system(s) from the level of the common femoral vein through the popliteal and proximal calf veins. COMPARISON:  None. FINDINGS: VENOUS Normal compressibility of the common femoral, superficial femoral, and popliteal veins, as well as the visualized calf veins. Visualized portions of profunda femoral vein and great saphenous vein unremarkable. No filling defects to suggest DVT on grayscale or color Doppler imaging. Doppler waveforms show normal direction of venous flow, normal respiratory plasticity and response to augmentation. Limited views of the contralateral common femoral vein are unremarkable. OTHER None. Limitations: none IMPRESSION: Negative. Electronically Signed   By: Prudencio Pair M.D.   On: 10/25/2020 12:03    Procedures Procedures   Medications Ordered in ED Medications - No data to display  ED Course  I have reviewed the triage vital signs and the nursing notes.  Pertinent labs & imaging results that were available during my care of the patient were reviewed by me and considered in my medical decision making (see chart for details).    MDM Rules/Calculators/A&P                         70 year old female presents with concern for left lower extremity calf pain x3 days.  History of DVT, not on any anticoagulation.  Differential diagnosis for this patient pain includes but is not limited to DVT, Achilles tendon rupture, calcaneal bursitis, cellulitis, gastroc strain, Baker's cyst, superficial thrombophlebitis, compartment syndrome.  Cardiopulmonary exam is normal, abdominal exam is benign.  Musculoskeletal exam revealed large firm area of apparent calcification at the base of the left Achilles tendon with associated tenderness palpation without crepitus, erythema, or signs  of trauma.  There is left calf tenderness palpation without any swelling, erythema, warmth to  the touch, or palpable mass.  Patient is neurovascular intact in all 4 extremities. We will proceed basic laboratory studies and DVT study of the left leg.  CBC is unremarkable, BMP is unremarkable, troponin is normal.  D-dimer was not obtained given history of malignancy.  Lower extremity Doppler negative for DVT. EKG reassuring.  Given reassuring physical exam, vital signs, laboratory, and imaging studies, no further work-up is warranted in ED at this time.  CTA PE study not obtained as patient is not experiencing any shortness of breath, chest pain, tachycardia, or tachypnea at this time.   While exact etiology of her symptoms remains unclear, there does not appear to be any emergent source for her calf pain.  No evidence of infectious etiology on physical exam.  Suspect patient symptoms are likely related to her Achilles tendon anomaly; recommend she follow-up closely with her orthopedic provider as discussed.  She should utilize her supportive boot at home and may utilize over-the-counter analgesic medications as needed for symptoms.  Recommend resting the leg and elevating it when able.  Shivali voiced understanding of her medical evaluation and treatment plan.  Each of her questions was answered to her expressed satisfaction.  Strict return precautions given.  Patient is well-appearing, stable, and appropriate for discharge at this time.  This chart was dictated using voice recognition software, Dragon. Despite the best efforts of this provider to proofread and correct errors, errors may still occur which can change documentation meaning.  Final Clinical Impression(s) / ED Diagnoses Final diagnoses:  None    Rx / DC Orders ED Discharge Orders    None       Aura Dials 10/25/20 1252    Davonna Belling, MD 10/25/20 1956

## 2020-11-18 DIAGNOSIS — M79605 Pain in left leg: Secondary | ICD-10-CM | POA: Diagnosis not present

## 2020-11-18 DIAGNOSIS — M7662 Achilles tendinitis, left leg: Secondary | ICD-10-CM | POA: Diagnosis not present

## 2020-12-30 DIAGNOSIS — M7662 Achilles tendinitis, left leg: Secondary | ICD-10-CM | POA: Diagnosis not present

## 2020-12-30 DIAGNOSIS — M79672 Pain in left foot: Secondary | ICD-10-CM | POA: Diagnosis not present

## 2021-02-10 DIAGNOSIS — M7662 Achilles tendinitis, left leg: Secondary | ICD-10-CM | POA: Diagnosis not present

## 2021-02-10 DIAGNOSIS — M79605 Pain in left leg: Secondary | ICD-10-CM | POA: Diagnosis not present

## 2021-03-27 ENCOUNTER — Other Ambulatory Visit (HOSPITAL_COMMUNITY): Payer: Self-pay | Admitting: Family Medicine

## 2021-03-27 DIAGNOSIS — Z1231 Encounter for screening mammogram for malignant neoplasm of breast: Secondary | ICD-10-CM

## 2021-04-10 ENCOUNTER — Other Ambulatory Visit: Payer: Self-pay

## 2021-04-10 ENCOUNTER — Ambulatory Visit (HOSPITAL_COMMUNITY)
Admission: RE | Admit: 2021-04-10 | Discharge: 2021-04-10 | Disposition: A | Discharge status: Home / Self Care | Payer: Medicare Other | Source: Ambulatory Visit | Attending: Family Medicine | Admitting: Family Medicine

## 2021-04-10 DIAGNOSIS — Z1231 Encounter for screening mammogram for malignant neoplasm of breast: Secondary | ICD-10-CM

## 2021-04-17 DIAGNOSIS — E559 Vitamin D deficiency, unspecified: Secondary | ICD-10-CM | POA: Diagnosis not present

## 2021-04-17 DIAGNOSIS — I10 Essential (primary) hypertension: Secondary | ICD-10-CM | POA: Diagnosis not present

## 2021-04-17 DIAGNOSIS — R7303 Prediabetes: Secondary | ICD-10-CM | POA: Diagnosis not present

## 2021-04-24 DIAGNOSIS — Z23 Encounter for immunization: Secondary | ICD-10-CM | POA: Diagnosis not present

## 2021-04-24 DIAGNOSIS — Z0001 Encounter for general adult medical examination with abnormal findings: Secondary | ICD-10-CM | POA: Diagnosis not present

## 2021-04-24 DIAGNOSIS — M7662 Achilles tendinitis, left leg: Secondary | ICD-10-CM | POA: Diagnosis not present

## 2021-04-24 DIAGNOSIS — R7303 Prediabetes: Secondary | ICD-10-CM | POA: Diagnosis not present

## 2021-04-24 DIAGNOSIS — F411 Generalized anxiety disorder: Secondary | ICD-10-CM | POA: Diagnosis not present

## 2021-04-24 DIAGNOSIS — E782 Mixed hyperlipidemia: Secondary | ICD-10-CM | POA: Diagnosis not present

## 2021-04-24 DIAGNOSIS — R945 Abnormal results of liver function studies: Secondary | ICD-10-CM | POA: Diagnosis not present

## 2021-04-24 DIAGNOSIS — E669 Obesity, unspecified: Secondary | ICD-10-CM | POA: Diagnosis not present

## 2021-04-24 DIAGNOSIS — E559 Vitamin D deficiency, unspecified: Secondary | ICD-10-CM | POA: Diagnosis not present

## 2021-04-24 DIAGNOSIS — R600 Localized edema: Secondary | ICD-10-CM | POA: Diagnosis not present

## 2021-07-21 DIAGNOSIS — M791 Myalgia, unspecified site: Secondary | ICD-10-CM | POA: Diagnosis not present

## 2021-07-21 DIAGNOSIS — R509 Fever, unspecified: Secondary | ICD-10-CM | POA: Diagnosis not present

## 2021-07-21 DIAGNOSIS — Z20822 Contact with and (suspected) exposure to covid-19: Secondary | ICD-10-CM | POA: Diagnosis not present

## 2021-07-21 DIAGNOSIS — R059 Cough, unspecified: Secondary | ICD-10-CM | POA: Diagnosis not present

## 2021-07-21 DIAGNOSIS — J029 Acute pharyngitis, unspecified: Secondary | ICD-10-CM | POA: Diagnosis not present

## 2021-10-17 DIAGNOSIS — H8110 Benign paroxysmal vertigo, unspecified ear: Secondary | ICD-10-CM | POA: Diagnosis not present

## 2021-10-19 DIAGNOSIS — E559 Vitamin D deficiency, unspecified: Secondary | ICD-10-CM | POA: Diagnosis not present

## 2021-10-19 DIAGNOSIS — R7303 Prediabetes: Secondary | ICD-10-CM | POA: Diagnosis not present

## 2021-10-19 DIAGNOSIS — E782 Mixed hyperlipidemia: Secondary | ICD-10-CM | POA: Diagnosis not present

## 2021-10-23 DIAGNOSIS — E782 Mixed hyperlipidemia: Secondary | ICD-10-CM | POA: Diagnosis not present

## 2021-10-23 DIAGNOSIS — R7303 Prediabetes: Secondary | ICD-10-CM | POA: Diagnosis not present

## 2021-10-23 DIAGNOSIS — Z6839 Body mass index (BMI) 39.0-39.9, adult: Secondary | ICD-10-CM | POA: Diagnosis not present

## 2021-10-23 DIAGNOSIS — R945 Abnormal results of liver function studies: Secondary | ICD-10-CM | POA: Diagnosis not present

## 2021-10-23 DIAGNOSIS — R600 Localized edema: Secondary | ICD-10-CM | POA: Diagnosis not present

## 2021-10-23 DIAGNOSIS — Z853 Personal history of malignant neoplasm of breast: Secondary | ICD-10-CM | POA: Diagnosis not present

## 2021-10-23 DIAGNOSIS — E559 Vitamin D deficiency, unspecified: Secondary | ICD-10-CM | POA: Diagnosis not present

## 2021-10-23 DIAGNOSIS — Z13820 Encounter for screening for osteoporosis: Secondary | ICD-10-CM | POA: Diagnosis not present

## 2021-10-23 DIAGNOSIS — E669 Obesity, unspecified: Secondary | ICD-10-CM | POA: Diagnosis not present

## 2021-10-23 DIAGNOSIS — M7662 Achilles tendinitis, left leg: Secondary | ICD-10-CM | POA: Diagnosis not present

## 2021-10-23 DIAGNOSIS — F411 Generalized anxiety disorder: Secondary | ICD-10-CM | POA: Diagnosis not present

## 2021-10-26 DIAGNOSIS — Z961 Presence of intraocular lens: Secondary | ICD-10-CM | POA: Diagnosis not present

## 2021-10-26 DIAGNOSIS — H524 Presbyopia: Secondary | ICD-10-CM | POA: Diagnosis not present

## 2021-11-14 ENCOUNTER — Encounter: Payer: Self-pay | Admitting: *Deleted

## 2022-02-08 DIAGNOSIS — M79672 Pain in left foot: Secondary | ICD-10-CM | POA: Diagnosis not present

## 2022-02-08 DIAGNOSIS — M7662 Achilles tendinitis, left leg: Secondary | ICD-10-CM | POA: Diagnosis not present

## 2022-03-30 ENCOUNTER — Other Ambulatory Visit (HOSPITAL_COMMUNITY): Payer: Self-pay | Admitting: Internal Medicine

## 2022-03-30 DIAGNOSIS — Z13820 Encounter for screening for osteoporosis: Secondary | ICD-10-CM

## 2022-03-30 DIAGNOSIS — Z780 Asymptomatic menopausal state: Secondary | ICD-10-CM

## 2022-04-05 ENCOUNTER — Other Ambulatory Visit (HOSPITAL_COMMUNITY): Payer: Self-pay | Admitting: Internal Medicine

## 2022-04-05 ENCOUNTER — Ambulatory Visit (HOSPITAL_COMMUNITY)
Admission: RE | Admit: 2022-04-05 | Discharge: 2022-04-05 | Disposition: A | Discharge status: Home / Self Care | Payer: Medicare Other | Source: Ambulatory Visit | Attending: Internal Medicine | Admitting: Internal Medicine

## 2022-04-05 DIAGNOSIS — Z780 Asymptomatic menopausal state: Secondary | ICD-10-CM | POA: Insufficient documentation

## 2022-04-05 DIAGNOSIS — M8589 Other specified disorders of bone density and structure, multiple sites: Secondary | ICD-10-CM | POA: Diagnosis not present

## 2022-04-05 DIAGNOSIS — Z1231 Encounter for screening mammogram for malignant neoplasm of breast: Secondary | ICD-10-CM

## 2022-04-05 DIAGNOSIS — Z13820 Encounter for screening for osteoporosis: Secondary | ICD-10-CM | POA: Diagnosis not present

## 2022-04-12 ENCOUNTER — Ambulatory Visit (HOSPITAL_COMMUNITY): Payer: Medicare Other

## 2022-04-16 IMAGING — US US EXTREM LOW VENOUS*L*
1 series · 14 of 24 positions shown · non-contrast
Comparison: None.

CLINICAL DATA: The calf pain and swelling

EXAM:
Left LOWER EXTREMITY VENOUS DOPPLER ULTRASOUND
TECHNIQUE: Gray-scale sonography with compression, as well as color and duplex
ultrasound, were performed to evaluate the deep venous system(s)
from the level of the common femoral vein through the popliteal and
proximal calf veins.

[Series 1: us venous img lower uni left (dvt) · portal-venous · 14 of 37 slices shown]
[im 1/37]
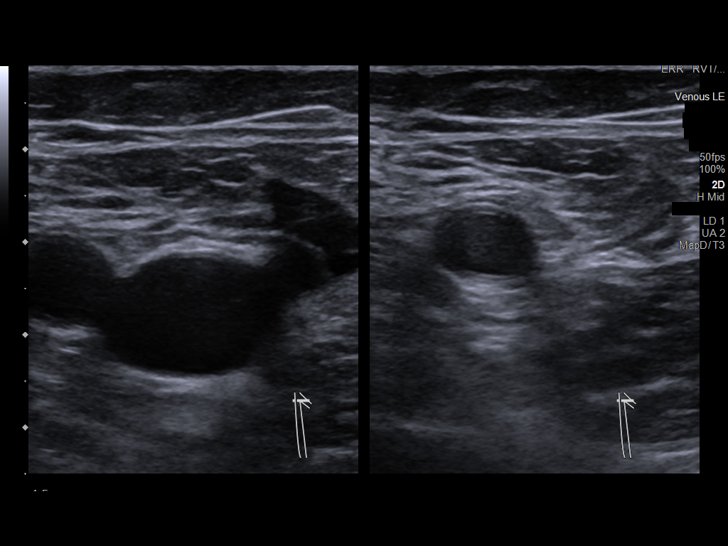
[im 4/37]
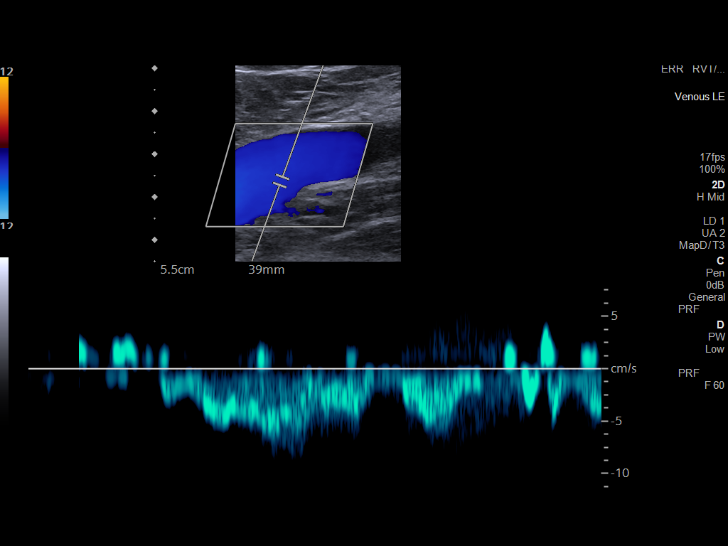
[im 7/37]
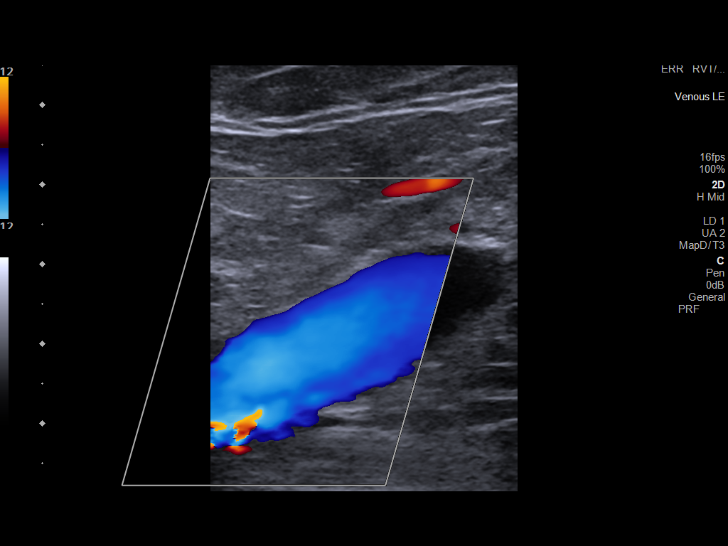
[im 10/37]
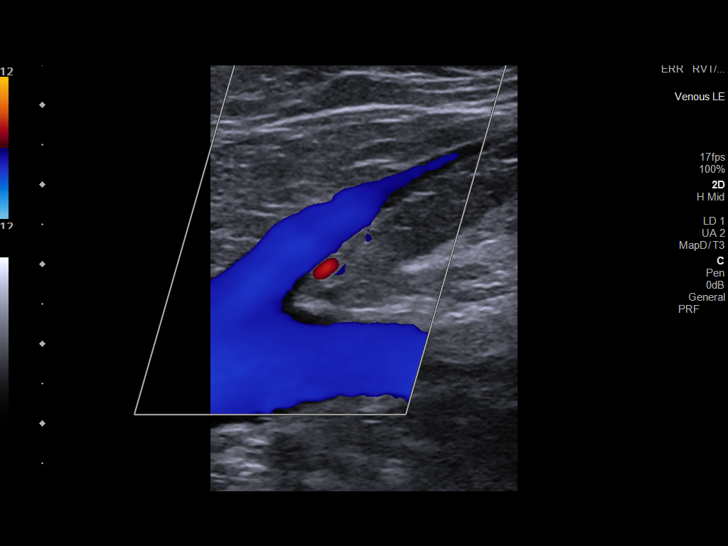
[im 11/37]
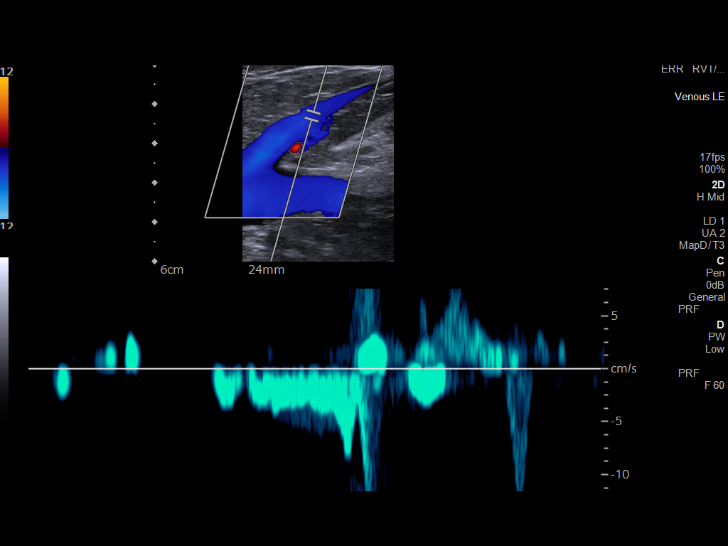
[im 15/37]
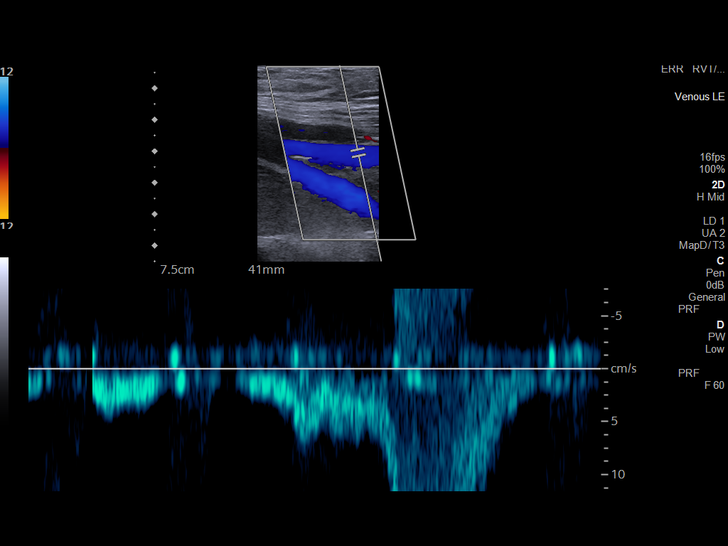
[im 18/37]
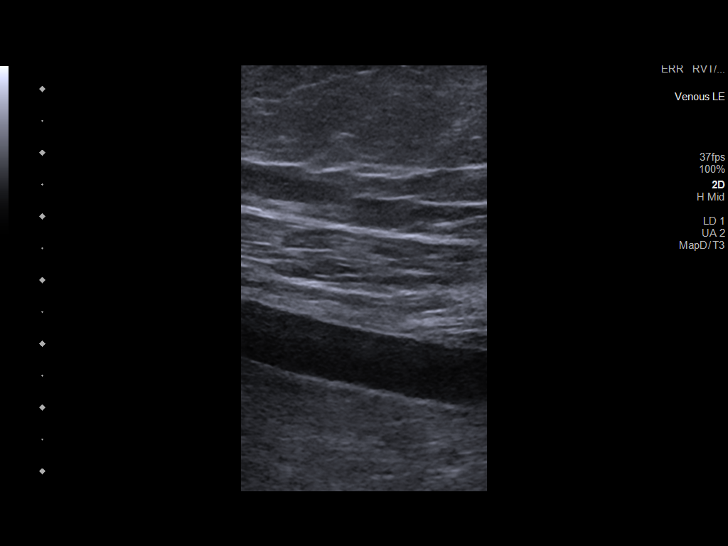
[im 19/37]
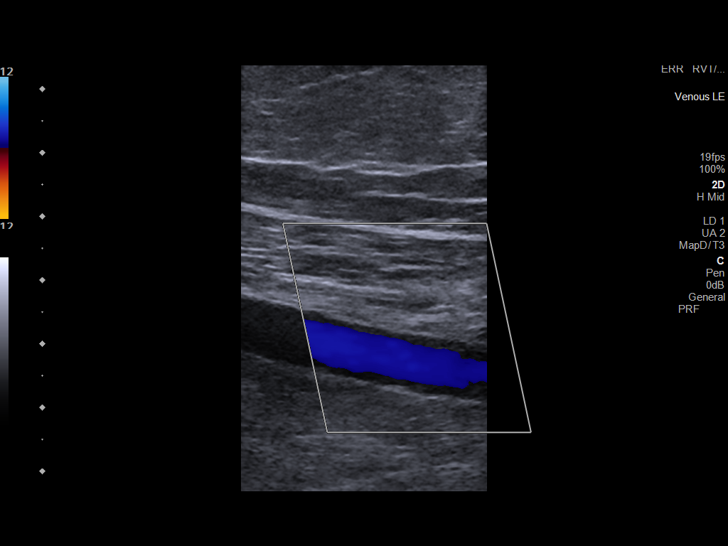
[im 22/37]
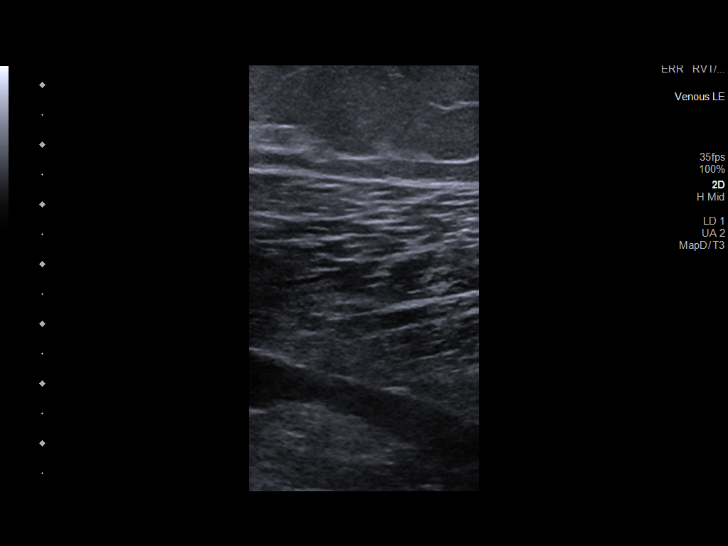
[im 26/37]
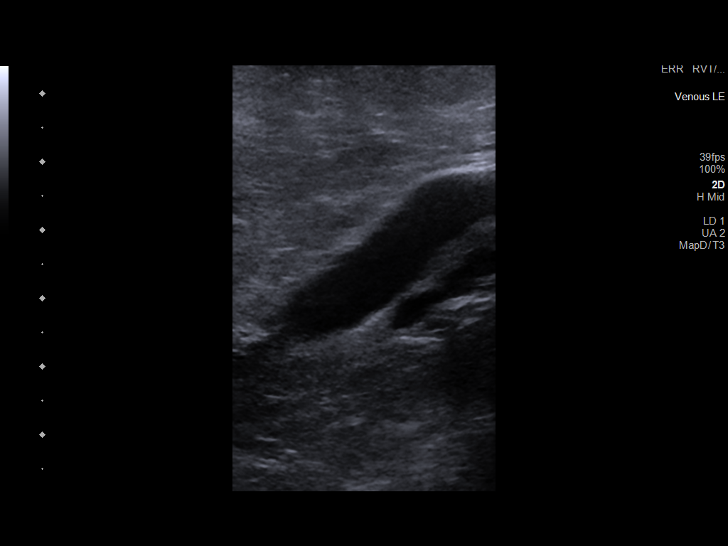
[im 29/37]
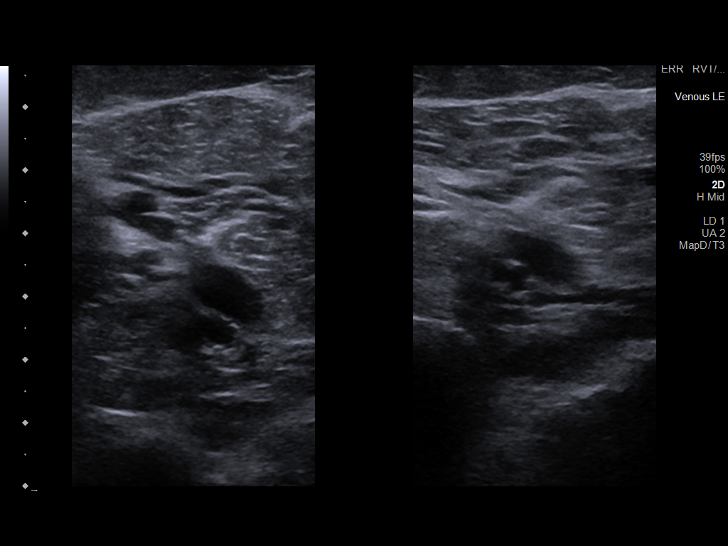
[im 30/37]
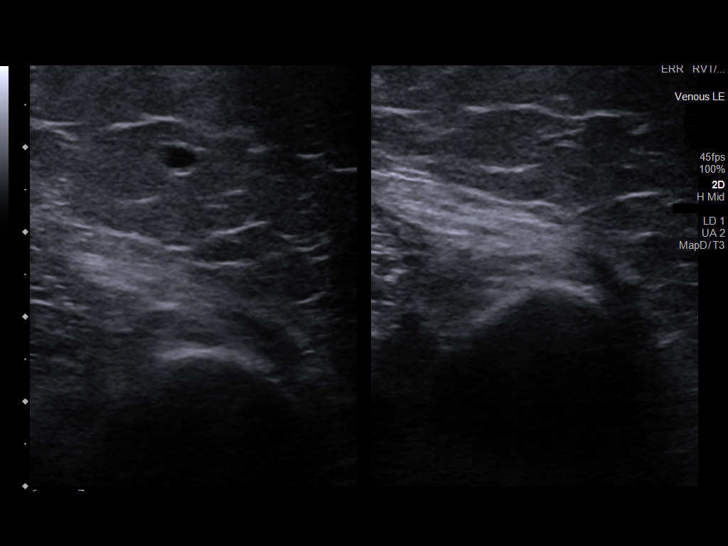
[im 33/37]
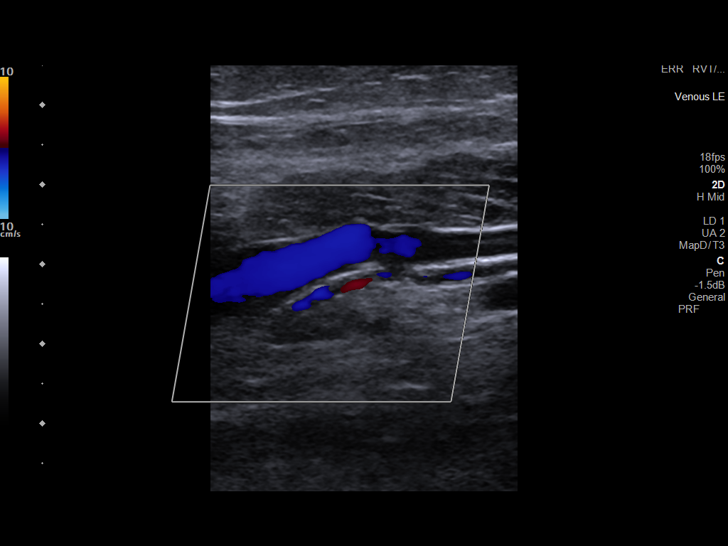
[im 37/37]
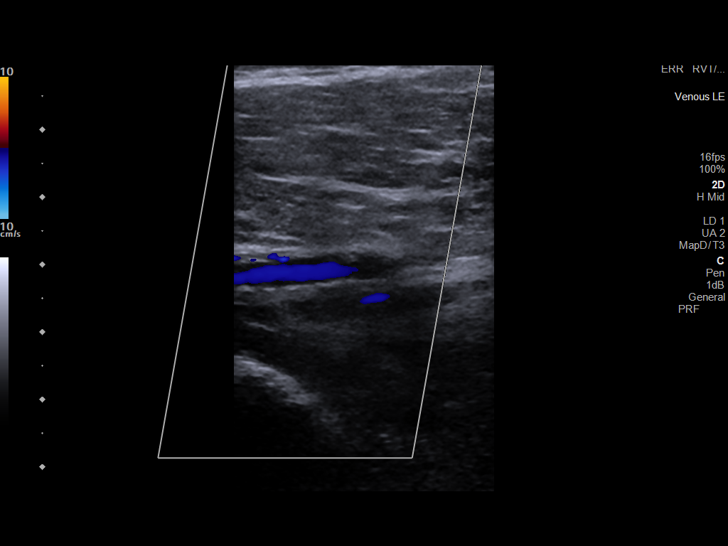

[14 of 24 positions shown; findings below may reference images not displayed]

FINDINGS: VENOUS

Normal compressibility of the common femoral, superficial femoral,
and popliteal veins, as well as the visualized calf veins.
Visualized portions of profunda femoral vein and great saphenous
vein unremarkable. No filling defects to suggest DVT on grayscale or
color Doppler imaging. Doppler waveforms show normal direction of
venous flow, normal respiratory plasticity and response to
augmentation.

Limited views of the contralateral common femoral vein are
unremarkable.

OTHER

None.

Limitations: none
IMPRESSION: Negative.

## 2022-04-18 ENCOUNTER — Ambulatory Visit (HOSPITAL_COMMUNITY)
Admission: RE | Admit: 2022-04-18 | Discharge: 2022-04-18 | Disposition: A | Discharge status: Home / Self Care | Payer: Medicare Other | Source: Ambulatory Visit | Attending: Internal Medicine | Admitting: Internal Medicine

## 2022-04-18 DIAGNOSIS — Z1231 Encounter for screening mammogram for malignant neoplasm of breast: Secondary | ICD-10-CM | POA: Insufficient documentation

## 2022-04-23 DIAGNOSIS — E782 Mixed hyperlipidemia: Secondary | ICD-10-CM | POA: Diagnosis not present

## 2022-04-23 DIAGNOSIS — E559 Vitamin D deficiency, unspecified: Secondary | ICD-10-CM | POA: Diagnosis not present

## 2022-04-23 DIAGNOSIS — R7303 Prediabetes: Secondary | ICD-10-CM | POA: Diagnosis not present

## 2022-05-01 DIAGNOSIS — R600 Localized edema: Secondary | ICD-10-CM | POA: Diagnosis not present

## 2022-05-01 DIAGNOSIS — E559 Vitamin D deficiency, unspecified: Secondary | ICD-10-CM | POA: Diagnosis not present

## 2022-05-01 DIAGNOSIS — M7662 Achilles tendinitis, left leg: Secondary | ICD-10-CM | POA: Diagnosis not present

## 2022-05-01 DIAGNOSIS — R7303 Prediabetes: Secondary | ICD-10-CM | POA: Diagnosis not present

## 2022-05-01 DIAGNOSIS — R945 Abnormal results of liver function studies: Secondary | ICD-10-CM | POA: Diagnosis not present

## 2022-05-01 DIAGNOSIS — Z0000 Encounter for general adult medical examination without abnormal findings: Secondary | ICD-10-CM | POA: Diagnosis not present

## 2022-05-01 DIAGNOSIS — Z23 Encounter for immunization: Secondary | ICD-10-CM | POA: Diagnosis not present

## 2022-05-01 DIAGNOSIS — F411 Generalized anxiety disorder: Secondary | ICD-10-CM | POA: Diagnosis not present

## 2022-05-01 DIAGNOSIS — Z853 Personal history of malignant neoplasm of breast: Secondary | ICD-10-CM | POA: Diagnosis not present

## 2022-05-01 DIAGNOSIS — M8580 Other specified disorders of bone density and structure, unspecified site: Secondary | ICD-10-CM | POA: Diagnosis not present

## 2022-05-01 DIAGNOSIS — E669 Obesity, unspecified: Secondary | ICD-10-CM | POA: Diagnosis not present

## 2022-05-01 DIAGNOSIS — E782 Mixed hyperlipidemia: Secondary | ICD-10-CM | POA: Diagnosis not present

## 2022-10-29 DIAGNOSIS — R7303 Prediabetes: Secondary | ICD-10-CM | POA: Diagnosis not present

## 2022-10-29 DIAGNOSIS — E559 Vitamin D deficiency, unspecified: Secondary | ICD-10-CM | POA: Diagnosis not present

## 2022-10-29 DIAGNOSIS — E782 Mixed hyperlipidemia: Secondary | ICD-10-CM | POA: Diagnosis not present

## 2022-10-31 DIAGNOSIS — Z853 Personal history of malignant neoplasm of breast: Secondary | ICD-10-CM | POA: Diagnosis not present

## 2022-10-31 DIAGNOSIS — F411 Generalized anxiety disorder: Secondary | ICD-10-CM | POA: Diagnosis not present

## 2022-10-31 DIAGNOSIS — R7303 Prediabetes: Secondary | ICD-10-CM | POA: Diagnosis not present

## 2022-10-31 DIAGNOSIS — E559 Vitamin D deficiency, unspecified: Secondary | ICD-10-CM | POA: Diagnosis not present

## 2022-10-31 DIAGNOSIS — R945 Abnormal results of liver function studies: Secondary | ICD-10-CM | POA: Diagnosis not present

## 2022-10-31 DIAGNOSIS — Z96649 Presence of unspecified artificial hip joint: Secondary | ICD-10-CM | POA: Diagnosis not present

## 2022-10-31 DIAGNOSIS — M8580 Other specified disorders of bone density and structure, unspecified site: Secondary | ICD-10-CM | POA: Diagnosis not present

## 2022-10-31 DIAGNOSIS — R600 Localized edema: Secondary | ICD-10-CM | POA: Diagnosis not present

## 2022-10-31 DIAGNOSIS — M7662 Achilles tendinitis, left leg: Secondary | ICD-10-CM | POA: Diagnosis not present

## 2022-10-31 DIAGNOSIS — E782 Mixed hyperlipidemia: Secondary | ICD-10-CM | POA: Diagnosis not present

## 2023-01-10 DIAGNOSIS — M2041 Other hammer toe(s) (acquired), right foot: Secondary | ICD-10-CM | POA: Diagnosis not present

## 2023-01-10 DIAGNOSIS — M79674 Pain in right toe(s): Secondary | ICD-10-CM | POA: Diagnosis not present

## 2023-01-10 DIAGNOSIS — M79672 Pain in left foot: Secondary | ICD-10-CM | POA: Diagnosis not present

## 2023-01-10 DIAGNOSIS — R300 Dysuria: Secondary | ICD-10-CM | POA: Diagnosis not present

## 2023-01-10 DIAGNOSIS — R319 Hematuria, unspecified: Secondary | ICD-10-CM | POA: Diagnosis not present

## 2023-01-10 DIAGNOSIS — M7662 Achilles tendinitis, left leg: Secondary | ICD-10-CM | POA: Diagnosis not present

## 2023-01-10 DIAGNOSIS — R399 Unspecified symptoms and signs involving the genitourinary system: Secondary | ICD-10-CM | POA: Diagnosis not present

## 2023-02-12 DIAGNOSIS — M2041 Other hammer toe(s) (acquired), right foot: Secondary | ICD-10-CM | POA: Diagnosis not present

## 2023-02-12 DIAGNOSIS — M7662 Achilles tendinitis, left leg: Secondary | ICD-10-CM | POA: Diagnosis not present

## 2023-02-12 DIAGNOSIS — M79674 Pain in right toe(s): Secondary | ICD-10-CM | POA: Diagnosis not present

## 2023-02-12 DIAGNOSIS — M79672 Pain in left foot: Secondary | ICD-10-CM | POA: Diagnosis not present

## 2023-03-12 DIAGNOSIS — H524 Presbyopia: Secondary | ICD-10-CM | POA: Diagnosis not present

## 2023-03-12 DIAGNOSIS — Z961 Presence of intraocular lens: Secondary | ICD-10-CM | POA: Diagnosis not present

## 2023-03-28 ENCOUNTER — Other Ambulatory Visit (HOSPITAL_COMMUNITY): Payer: Self-pay | Admitting: Internal Medicine

## 2023-03-28 DIAGNOSIS — Z1231 Encounter for screening mammogram for malignant neoplasm of breast: Secondary | ICD-10-CM

## 2023-04-22 ENCOUNTER — Ambulatory Visit (HOSPITAL_COMMUNITY)
Admission: RE | Admit: 2023-04-22 | Discharge: 2023-04-22 | Disposition: A | Discharge status: Home / Self Care | Payer: Medicare Other | Source: Ambulatory Visit | Attending: Internal Medicine | Admitting: Internal Medicine

## 2023-04-22 DIAGNOSIS — Z1231 Encounter for screening mammogram for malignant neoplasm of breast: Secondary | ICD-10-CM | POA: Diagnosis not present

## 2023-04-26 DIAGNOSIS — E559 Vitamin D deficiency, unspecified: Secondary | ICD-10-CM | POA: Diagnosis not present

## 2023-04-26 DIAGNOSIS — R7303 Prediabetes: Secondary | ICD-10-CM | POA: Diagnosis not present

## 2023-04-26 DIAGNOSIS — E782 Mixed hyperlipidemia: Secondary | ICD-10-CM | POA: Diagnosis not present

## 2023-05-02 DIAGNOSIS — Z23 Encounter for immunization: Secondary | ICD-10-CM | POA: Diagnosis not present

## 2023-05-02 DIAGNOSIS — R7989 Other specified abnormal findings of blood chemistry: Secondary | ICD-10-CM | POA: Diagnosis not present

## 2023-05-02 DIAGNOSIS — E782 Mixed hyperlipidemia: Secondary | ICD-10-CM | POA: Diagnosis not present

## 2023-05-02 DIAGNOSIS — Z853 Personal history of malignant neoplasm of breast: Secondary | ICD-10-CM | POA: Diagnosis not present

## 2023-05-02 DIAGNOSIS — F411 Generalized anxiety disorder: Secondary | ICD-10-CM | POA: Diagnosis not present

## 2023-05-02 DIAGNOSIS — M8580 Other specified disorders of bone density and structure, unspecified site: Secondary | ICD-10-CM | POA: Diagnosis not present

## 2023-05-02 DIAGNOSIS — E559 Vitamin D deficiency, unspecified: Secondary | ICD-10-CM | POA: Diagnosis not present

## 2023-05-02 DIAGNOSIS — R7303 Prediabetes: Secondary | ICD-10-CM | POA: Diagnosis not present

## 2023-05-02 DIAGNOSIS — M7662 Achilles tendinitis, left leg: Secondary | ICD-10-CM | POA: Diagnosis not present

## 2023-05-02 DIAGNOSIS — R600 Localized edema: Secondary | ICD-10-CM | POA: Diagnosis not present

## 2023-05-14 DIAGNOSIS — M79674 Pain in right toe(s): Secondary | ICD-10-CM | POA: Diagnosis not present

## 2023-05-14 DIAGNOSIS — M2041 Other hammer toe(s) (acquired), right foot: Secondary | ICD-10-CM | POA: Diagnosis not present

## 2023-05-16 DIAGNOSIS — Z1211 Encounter for screening for malignant neoplasm of colon: Secondary | ICD-10-CM | POA: Diagnosis not present

## 2023-05-17 DIAGNOSIS — Z01818 Encounter for other preprocedural examination: Secondary | ICD-10-CM | POA: Diagnosis not present

## 2023-05-24 DIAGNOSIS — M2041 Other hammer toe(s) (acquired), right foot: Secondary | ICD-10-CM | POA: Diagnosis not present

## 2023-05-24 DIAGNOSIS — M19071 Primary osteoarthritis, right ankle and foot: Secondary | ICD-10-CM | POA: Diagnosis not present

## 2023-05-24 DIAGNOSIS — M79674 Pain in right toe(s): Secondary | ICD-10-CM | POA: Diagnosis not present

## 2023-05-24 DIAGNOSIS — M7731 Calcaneal spur, right foot: Secondary | ICD-10-CM | POA: Diagnosis not present

## 2023-05-26 LAB — COLOGUARD: COLOGUARD: NEGATIVE

## 2023-05-28 DIAGNOSIS — Z4889 Encounter for other specified surgical aftercare: Secondary | ICD-10-CM | POA: Diagnosis not present

## 2023-06-04 DIAGNOSIS — R9431 Abnormal electrocardiogram [ECG] [EKG]: Secondary | ICD-10-CM | POA: Diagnosis not present

## 2023-06-04 DIAGNOSIS — Z01810 Encounter for preprocedural cardiovascular examination: Secondary | ICD-10-CM | POA: Diagnosis not present

## 2023-06-11 DIAGNOSIS — Z4889 Encounter for other specified surgical aftercare: Secondary | ICD-10-CM | POA: Diagnosis not present

## 2023-07-02 DIAGNOSIS — Z4889 Encounter for other specified surgical aftercare: Secondary | ICD-10-CM | POA: Diagnosis not present

## 2023-07-16 DIAGNOSIS — Z4889 Encounter for other specified surgical aftercare: Secondary | ICD-10-CM | POA: Diagnosis not present

## 2023-07-16 DIAGNOSIS — R600 Localized edema: Secondary | ICD-10-CM | POA: Diagnosis not present

## 2023-09-10 DIAGNOSIS — Z4889 Encounter for other specified surgical aftercare: Secondary | ICD-10-CM | POA: Diagnosis not present

## 2023-10-02 DIAGNOSIS — Z96642 Presence of left artificial hip joint: Secondary | ICD-10-CM | POA: Diagnosis not present

## 2023-10-02 DIAGNOSIS — M25552 Pain in left hip: Secondary | ICD-10-CM | POA: Diagnosis not present

## 2023-10-25 DIAGNOSIS — E782 Mixed hyperlipidemia: Secondary | ICD-10-CM | POA: Diagnosis not present

## 2023-10-25 DIAGNOSIS — E559 Vitamin D deficiency, unspecified: Secondary | ICD-10-CM | POA: Diagnosis not present

## 2023-10-25 DIAGNOSIS — R7303 Prediabetes: Secondary | ICD-10-CM | POA: Diagnosis not present

## 2023-10-31 DIAGNOSIS — F411 Generalized anxiety disorder: Secondary | ICD-10-CM | POA: Diagnosis not present

## 2023-10-31 DIAGNOSIS — R7303 Prediabetes: Secondary | ICD-10-CM | POA: Diagnosis not present

## 2023-10-31 DIAGNOSIS — R945 Abnormal results of liver function studies: Secondary | ICD-10-CM | POA: Diagnosis not present

## 2023-10-31 DIAGNOSIS — R600 Localized edema: Secondary | ICD-10-CM | POA: Diagnosis not present

## 2023-10-31 DIAGNOSIS — M7662 Achilles tendinitis, left leg: Secondary | ICD-10-CM | POA: Diagnosis not present

## 2023-10-31 DIAGNOSIS — E559 Vitamin D deficiency, unspecified: Secondary | ICD-10-CM | POA: Diagnosis not present

## 2023-10-31 DIAGNOSIS — L709 Acne, unspecified: Secondary | ICD-10-CM | POA: Diagnosis not present

## 2023-10-31 DIAGNOSIS — M2041 Other hammer toe(s) (acquired), right foot: Secondary | ICD-10-CM | POA: Diagnosis not present

## 2023-10-31 DIAGNOSIS — E782 Mixed hyperlipidemia: Secondary | ICD-10-CM | POA: Diagnosis not present

## 2023-10-31 DIAGNOSIS — M8580 Other specified disorders of bone density and structure, unspecified site: Secondary | ICD-10-CM | POA: Diagnosis not present

## 2024-03-12 DIAGNOSIS — S86012A Strain of left Achilles tendon, initial encounter: Secondary | ICD-10-CM | POA: Diagnosis not present

## 2024-03-12 DIAGNOSIS — M79672 Pain in left foot: Secondary | ICD-10-CM | POA: Diagnosis not present

## 2024-03-12 DIAGNOSIS — S9030XA Contusion of unspecified foot, initial encounter: Secondary | ICD-10-CM | POA: Diagnosis not present

## 2024-03-24 DIAGNOSIS — Z961 Presence of intraocular lens: Secondary | ICD-10-CM | POA: Diagnosis not present

## 2024-03-24 DIAGNOSIS — H524 Presbyopia: Secondary | ICD-10-CM | POA: Diagnosis not present

## 2024-04-08 ENCOUNTER — Other Ambulatory Visit (HOSPITAL_COMMUNITY): Payer: Self-pay | Admitting: Internal Medicine

## 2024-04-08 DIAGNOSIS — Z1231 Encounter for screening mammogram for malignant neoplasm of breast: Secondary | ICD-10-CM

## 2024-04-14 ENCOUNTER — Encounter (HOSPITAL_COMMUNITY): Payer: Self-pay | Admitting: Podiatry

## 2024-04-14 DIAGNOSIS — S86012D Strain of left Achilles tendon, subsequent encounter: Secondary | ICD-10-CM | POA: Diagnosis not present

## 2024-04-14 DIAGNOSIS — M79672 Pain in left foot: Secondary | ICD-10-CM | POA: Diagnosis not present

## 2024-04-15 ENCOUNTER — Other Ambulatory Visit: Payer: Self-pay | Admitting: Podiatry

## 2024-04-15 ENCOUNTER — Other Ambulatory Visit (HOSPITAL_COMMUNITY): Payer: Self-pay | Admitting: Podiatry

## 2024-04-15 DIAGNOSIS — S86012D Strain of left Achilles tendon, subsequent encounter: Secondary | ICD-10-CM

## 2024-04-17 ENCOUNTER — Ambulatory Visit (HOSPITAL_COMMUNITY)

## 2024-04-22 ENCOUNTER — Ambulatory Visit (HOSPITAL_COMMUNITY)
Admission: RE | Admit: 2024-04-22 | Discharge: 2024-04-22 | Disposition: A | Discharge status: Home / Self Care | Source: Ambulatory Visit | Attending: Internal Medicine | Admitting: Internal Medicine

## 2024-04-22 ENCOUNTER — Encounter (HOSPITAL_COMMUNITY): Payer: Self-pay

## 2024-04-22 DIAGNOSIS — Z1231 Encounter for screening mammogram for malignant neoplasm of breast: Secondary | ICD-10-CM | POA: Insufficient documentation

## 2024-04-23 ENCOUNTER — Ambulatory Visit (HOSPITAL_COMMUNITY)
Admission: RE | Admit: 2024-04-23 | Discharge: 2024-04-23 | Disposition: A | Discharge status: Home / Self Care | Source: Ambulatory Visit | Attending: Podiatry | Admitting: Podiatry

## 2024-04-23 DIAGNOSIS — R600 Localized edema: Secondary | ICD-10-CM | POA: Diagnosis not present

## 2024-04-23 DIAGNOSIS — S86012D Strain of left Achilles tendon, subsequent encounter: Secondary | ICD-10-CM | POA: Insufficient documentation

## 2024-04-30 DIAGNOSIS — S86012D Strain of left Achilles tendon, subsequent encounter: Secondary | ICD-10-CM | POA: Diagnosis not present

## 2024-05-06 DIAGNOSIS — R7303 Prediabetes: Secondary | ICD-10-CM | POA: Diagnosis not present

## 2024-05-06 DIAGNOSIS — E559 Vitamin D deficiency, unspecified: Secondary | ICD-10-CM | POA: Diagnosis not present

## 2024-05-06 DIAGNOSIS — E782 Mixed hyperlipidemia: Secondary | ICD-10-CM | POA: Diagnosis not present

## 2024-05-11 DIAGNOSIS — Z23 Encounter for immunization: Secondary | ICD-10-CM | POA: Diagnosis not present

## 2024-05-12 ENCOUNTER — Other Ambulatory Visit (HOSPITAL_COMMUNITY): Payer: Self-pay | Admitting: Internal Medicine

## 2024-05-12 DIAGNOSIS — R945 Abnormal results of liver function studies: Secondary | ICD-10-CM | POA: Diagnosis not present

## 2024-05-12 DIAGNOSIS — F411 Generalized anxiety disorder: Secondary | ICD-10-CM | POA: Diagnosis not present

## 2024-05-12 DIAGNOSIS — M8588 Other specified disorders of bone density and structure, other site: Secondary | ICD-10-CM

## 2024-05-12 DIAGNOSIS — S86012D Strain of left Achilles tendon, subsequent encounter: Secondary | ICD-10-CM | POA: Diagnosis not present

## 2024-05-12 DIAGNOSIS — Z0000 Encounter for general adult medical examination without abnormal findings: Secondary | ICD-10-CM | POA: Diagnosis not present

## 2024-05-12 DIAGNOSIS — E559 Vitamin D deficiency, unspecified: Secondary | ICD-10-CM | POA: Diagnosis not present

## 2024-05-12 DIAGNOSIS — G4700 Insomnia, unspecified: Secondary | ICD-10-CM | POA: Diagnosis not present

## 2024-05-12 DIAGNOSIS — E782 Mixed hyperlipidemia: Secondary | ICD-10-CM | POA: Diagnosis not present

## 2024-05-12 DIAGNOSIS — M8580 Other specified disorders of bone density and structure, unspecified site: Secondary | ICD-10-CM

## 2024-05-12 DIAGNOSIS — M7662 Achilles tendinitis, left leg: Secondary | ICD-10-CM | POA: Diagnosis not present

## 2024-05-12 DIAGNOSIS — Z0001 Encounter for general adult medical examination with abnormal findings: Secondary | ICD-10-CM | POA: Diagnosis not present

## 2024-05-12 DIAGNOSIS — R7303 Prediabetes: Secondary | ICD-10-CM | POA: Diagnosis not present

## 2024-05-28 DIAGNOSIS — S86012D Strain of left Achilles tendon, subsequent encounter: Secondary | ICD-10-CM | POA: Diagnosis not present

## 2024-06-05 ENCOUNTER — Ambulatory Visit
Admission: EM | Admit: 2024-06-05 | Discharge: 2024-06-05 | Disposition: A | Discharge status: Home / Self Care | Attending: Nurse Practitioner | Admitting: Nurse Practitioner

## 2024-06-05 DIAGNOSIS — J069 Acute upper respiratory infection, unspecified: Secondary | ICD-10-CM | POA: Diagnosis not present

## 2024-06-05 LAB — POC SOFIA SARS ANTIGEN FIA: SARS Coronavirus 2 Ag: NEGATIVE

## 2024-06-05 MED ORDER — BENZONATATE 100 MG PO CAPS: 100.0000 mg | ORAL_CAPSULE | Freq: Three times a day (TID) | ORAL | 0 refills | Status: AC | PRN

## 2024-06-05 NOTE — Discharge Instructions (Addendum)
 You have a viral upper respiratory infection.  Symptoms should improve over the next week to 10 days.  If you develop chest pain or shortness of breath, go to the emergency room.  COVID-19 test is negative.   Some things that can make you feel better are: - Increased rest - Increasing fluid with water /sugar free electrolytes - Acetaminophen  and ibuprofen as needed for fever/pain - Salt water  gargling, chloraseptic spray and throat lozenges - OTC guaifenesin (Mucinex) 600 mg twice daily for congestion - Continue cetirizine daily  - Saline sinus flushes or a neti pot - Humidifying the air -Tessalon Perles every 8 hours as needed for dry cough

## 2024-06-05 NOTE — ED Provider Notes (Signed)
 [facility]    CSN: [id number] Arrival date & time: 06/05/24  1122      History   Chief Complaint No chief complaint on file.   HPI Angelica White is a 73 y.o. female.   Patient presents today for 2 day history of congested cough, sinus drainage, post nasal drainage, runny nose, hoarseness, headache in my cheeks, decreased appetite, and fatigue.  No fever, shortness of breath or chest pain, nasal congestion, abdominal pain, nausea/vomiting, diarrhea.  Neighbor who stays with her 64 year old mother is sick with similar symptoms.  Has taken Zyrtec which helped a little bit.      Past Medical History:  Diagnosis Date   Cancer ([facility]) 2003   Lt. Breast cancer   Personal history of chemotherapy 2003    Patient Active Problem List   Diagnosis Date Noted   Obese 08/07/2017   S/P left THA, AA 08/06/2017    Past Surgical History:  Procedure Laterality Date   BREAST LUMPECTOMY Left 2003   also nipple removed   BREAST SURGERY Left 2003   to make breast even   CARPAL TUNNEL RELEASE Left    CATARACT EXTRACTION, BILATERAL  2018   CHOLECYSTECTOMY     COLONOSCOPY N/A 12/27/2016   Procedure: COLONOSCOPY;  Surgeon: Shaaron Lamar HERO, MD;  Location: [facility];  Service: Endoscopy;  Laterality: N/A;  930    LIPOSUCTION  2003   TOTAL HIP ARTHROPLASTY Left 08/06/2017   Procedure: LEFT TOTAL HIP ARTHROPLASTY ANTERIOR APPROACH;  Surgeon: Ernie Cough, MD;  Location: [facility];  Service: Orthopedics;  Laterality: Left;  70 mins    OB History   No obstetric history on file.      Home Medications    Prior to Admission medications   Medication Sig Start Date End Date Taking? Authorizing Provider  benzonatate (TESSALON) 100 MG capsule Take 1 capsule (100 mg total) by mouth 3 (three) times daily as needed for cough. Do not take with alcohol or while operating or driving heavy machinery 88/2/74  Yes Chandra Harlene LABOR, NP  aspirin  EC 81 MG tablet Take 81 mg by mouth daily  as needed. Swallow whole.    [provider]  docusate sodium  (COLACE) 100 MG capsule Take 1 capsule (100 mg total) by mouth 2 (two) times daily. Patient not taking: No sig reported 08/06/17   Danella Cough, PA-C  ferrous sulfate  (FERROUSUL) 325 (65 FE) MG tablet Take 1 tablet (325 mg total) by mouth 3 (three) times daily with meals. Patient not taking: No sig reported 08/06/17   Danella Cough, PA-C  HYDROcodone -acetaminophen  (NORCO) 7.5-325 MG tablet Take 1-2 tablets by mouth every 4 (four) hours as needed for moderate pain or severe pain. Patient not taking: No sig reported 08/06/17   Danella Cough, PA-C  ibuprofen (ADVIL) 200 MG tablet Take 600 mg by mouth every 6 (six) hours as needed.    [provider]  methocarbamol  (ROBAXIN ) 500 MG tablet Take 1 tablet (500 mg total) by mouth every 6 (six) hours as needed for muscle spasms. Patient not taking: No sig reported 08/06/17   Danella Cough, PA-C  polyethylene glycol (MIRALAX  / GLYCOLAX ) packet Take 17 g by mouth daily. Patient not taking: No sig reported 08/06/17   Danella Cough, PA-C  polyethylene glycol (MIRALAX  / GLYCOLAX ) packet Take 17 g by mouth 2 (two) times daily. Patient not taking: No sig reported 08/06/17   Danella Cough, PA-C  rivaroxaban  (XARELTO ) 10 MG TABS tablet Take  1 tablet (10 mg total) by mouth daily for 14 days. 08/08/17 08/22/17  Danella Cough, PA-C    Family History Family History  Problem Relation Age of Onset   Breast cancer Paternal Aunt     Social History Social History   Tobacco Use   Smoking status: Never   Smokeless tobacco: Never  Vaping Use   Vaping status: Never Used  Substance Use Topics   Alcohol use: No   Drug use: No     Allergies   Amoxicillin-pot clavulanate   Review of Systems Review of Systems Per HPI  Physical Exam Triage Vital Signs ED Triage Vitals [06/05/24 1209]  Encounter Vitals Group     BP 137/84     Girls Systolic BP Percentile      Girls  Diastolic BP Percentile      Boys Systolic BP Percentile      Boys Diastolic BP Percentile      Pulse Rate 69     Resp 18     Temp 98.3 F (36.8 C)     Temp Source Oral     SpO2 94 %     Weight      Height      Head Circumference      Peak Flow      Pain Score 0     Pain Loc      Pain Education      Exclude from Growth Chart    No data found.  Updated Vital Signs BP 137/84 (BP Location: Right Arm)   Pulse 69   Temp 98.3 F (36.8 C) (Oral)   Resp 18   SpO2 94%   Visual Acuity Right Eye Distance:   Left Eye Distance:   Bilateral Distance:    Right Eye Near:   Left Eye Near:    Bilateral Near:     Physical Exam Vitals and nursing note reviewed.  Constitutional:      General: She is not in acute distress.    Appearance: Normal appearance. She is not ill-appearing or toxic-appearing.  HENT:     Head: Normocephalic and atraumatic.     Right Ear: Tympanic membrane, ear canal and external ear normal.     Left Ear: Tympanic membrane, ear canal and external ear normal.     Nose: No congestion or rhinorrhea.     Mouth/Throat:     Mouth: Mucous membranes are moist.     Pharynx: Oropharynx is clear. Postnasal drip present. No oropharyngeal exudate or posterior oropharyngeal erythema.  Eyes:     General: No scleral icterus.    Extraocular Movements: Extraocular movements intact.  Cardiovascular:     Rate and Rhythm: Normal rate and regular rhythm.  Pulmonary:     Effort: Pulmonary effort is normal. No respiratory distress.     Breath sounds: Normal breath sounds. No wheezing, rhonchi or rales.  Musculoskeletal:     Cervical back: Normal range of motion and neck supple.  Lymphadenopathy:     Cervical: No cervical adenopathy.  Skin:    General: Skin is warm and dry.     Coloration: Skin is not jaundiced or pale.     Findings: No erythema or rash.  Neurological:     Mental Status: She is alert and oriented to person, place, and time.  Psychiatric:        Behavior:  Behavior is cooperative.      UC Treatments / Results  Labs (all labs ordered are listed, but only abnormal  results are displayed) Labs Reviewed  POC SOFIA SARS ANTIGEN FIA    EKG   Radiology No results found.  Procedures Procedures (including critical care time)  Medications Ordered in UC Medications - No data to display  Initial Impression / Assessment and Plan / UC Course  I have reviewed the triage vital signs and the nursing notes.  Pertinent labs & imaging results that were available during my care of the patient were reviewed by me and considered in my medical decision making (see chart for details).   Patient is well-appearing, normotensive, afebrile, not tachycardic, not tachypneic, oxygenating well on room air.   1. Viral URI with cough Vitals and exam are reassuring today COVID-19 test is negative Supportive care discussed with patient, start guaifenesin in addition to cetirizine and cough suppressant medication ER and return precautions discussed with patient  The patient was given the opportunity to ask questions.  All questions answered to their satisfaction.  The patient is in agreement to this plan.   Final Clinical Impressions(s) / UC Diagnoses   Final diagnoses:  Viral URI with cough     Discharge Instructions      You have a viral upper respiratory infection.  Symptoms should improve over the next week to 10 days.  If you develop chest pain or shortness of breath, go to the emergency room.  COVID-19 test is negative.   Some things that can make you feel better are: - Increased rest - Increasing fluid with water /sugar free electrolytes - Acetaminophen  and ibuprofen as needed for fever/pain - Salt water  gargling, chloraseptic spray and throat lozenges - OTC guaifenesin (Mucinex) 600 mg twice daily for congestion - Continue cetirizine daily  - Saline sinus flushes or a neti pot - Humidifying the air -Tessalon Perles every 8 hours as needed  for dry cough      ED Prescriptions     Medication Sig Dispense Auth. Provider   benzonatate (TESSALON) 100 MG capsule Take 1 capsule (100 mg total) by mouth 3 (three) times daily as needed for cough. Do not take with alcohol or while operating or driving heavy machinery 21 capsule Chandra Harlene LABOR, NP      PDMP not reviewed this encounter.   Chandra Harlene LABOR, NP 06/05/24 1401

## 2024-06-05 NOTE — ED Triage Notes (Signed)
 Pt reports she has some nasal congestion, sinus drainage, cough, hoarse voice x 2 days    Took zyrtec and tylenol 

## 2024-06-09 DIAGNOSIS — J069 Acute upper respiratory infection, unspecified: Secondary | ICD-10-CM | POA: Diagnosis not present

## 2024-06-09 DIAGNOSIS — J302 Other seasonal allergic rhinitis: Secondary | ICD-10-CM | POA: Diagnosis not present

## 2024-06-16 ENCOUNTER — Ambulatory Visit (HOSPITAL_COMMUNITY)
Admission: RE | Admit: 2024-06-16 | Discharge: 2024-06-16 | Disposition: A | Discharge status: Home / Self Care | Source: Ambulatory Visit | Attending: Internal Medicine | Admitting: Internal Medicine

## 2024-06-16 DIAGNOSIS — M85851 Other specified disorders of bone density and structure, right thigh: Secondary | ICD-10-CM | POA: Diagnosis not present

## 2024-06-16 DIAGNOSIS — M8580 Other specified disorders of bone density and structure, unspecified site: Secondary | ICD-10-CM | POA: Insufficient documentation

## 2024-06-16 DIAGNOSIS — M8588 Other specified disorders of bone density and structure, other site: Secondary | ICD-10-CM | POA: Diagnosis not present

## 2024-06-16 DIAGNOSIS — M85832 Other specified disorders of bone density and structure, left forearm: Secondary | ICD-10-CM | POA: Diagnosis not present

## 2024-06-16 DIAGNOSIS — Z780 Asymptomatic menopausal state: Secondary | ICD-10-CM | POA: Diagnosis not present

## 2024-06-18 DIAGNOSIS — S86012D Strain of left Achilles tendon, subsequent encounter: Secondary | ICD-10-CM | POA: Diagnosis not present

## 2024-06-30 DIAGNOSIS — M8580 Other specified disorders of bone density and structure, unspecified site: Secondary | ICD-10-CM | POA: Diagnosis not present

## 2024-06-30 DIAGNOSIS — Z713 Dietary counseling and surveillance: Secondary | ICD-10-CM | POA: Diagnosis not present

## 2024-06-30 DIAGNOSIS — J302 Other seasonal allergic rhinitis: Secondary | ICD-10-CM | POA: Diagnosis not present

## 2024-06-30 DIAGNOSIS — Z7182 Exercise counseling: Secondary | ICD-10-CM | POA: Diagnosis not present

## 2024-06-30 DIAGNOSIS — S86012D Strain of left Achilles tendon, subsequent encounter: Secondary | ICD-10-CM | POA: Diagnosis not present

## 2024-06-30 DIAGNOSIS — X58XXXD Exposure to other specified factors, subsequent encounter: Secondary | ICD-10-CM | POA: Diagnosis not present

## 2024-06-30 DIAGNOSIS — M7662 Achilles tendinitis, left leg: Secondary | ICD-10-CM | POA: Diagnosis not present

## 2024-06-30 DIAGNOSIS — Z6839 Body mass index (BMI) 39.0-39.9, adult: Secondary | ICD-10-CM | POA: Diagnosis not present
